# Patient Record
Sex: Male | Born: 1950 | Race: White | Hispanic: No | Marital: Married
Health system: Southern US, Community
[De-identification: ages and names within clinical notes are randomized; demographics above are authoritative.]

## PROBLEM LIST (undated history)

## (undated) DIAGNOSIS — T7840XA Allergy, unspecified, initial encounter: Secondary | ICD-10-CM

## (undated) DIAGNOSIS — C4491 Basal cell carcinoma of skin, unspecified: Secondary | ICD-10-CM

## (undated) DIAGNOSIS — E7800 Pure hypercholesterolemia, unspecified: Secondary | ICD-10-CM

## (undated) HISTORY — DX: Pure hypercholesterolemia, unspecified: E78.00

## (undated) HISTORY — PX: EYE SURGERY: SHX253

## (undated) HISTORY — DX: Basal cell carcinoma of skin, unspecified: C44.91

## (undated) HISTORY — PX: HERNIA REPAIR: SHX51

## (undated) HISTORY — DX: Allergy, unspecified, initial encounter: T78.40XA

---

## 2019-07-25 ENCOUNTER — Other Ambulatory Visit: Payer: Self-pay | Admitting: Otolaryngology

## 2019-07-25 ENCOUNTER — Other Ambulatory Visit (HOSPITAL_BASED_OUTPATIENT_CLINIC_OR_DEPARTMENT_OTHER): Payer: Self-pay | Admitting: Otolaryngology

## 2019-07-25 DIAGNOSIS — R29810 Facial weakness: Secondary | ICD-10-CM

## 2019-07-25 DIAGNOSIS — M5481 Occipital neuralgia: Secondary | ICD-10-CM

## 2019-07-25 DIAGNOSIS — M26621 Arthralgia of right temporomandibular joint: Secondary | ICD-10-CM

## 2019-07-25 DIAGNOSIS — R200 Anesthesia of skin: Secondary | ICD-10-CM

## 2019-07-25 DIAGNOSIS — R6884 Jaw pain: Secondary | ICD-10-CM

## 2019-07-25 DIAGNOSIS — M26601 Right temporomandibular joint disorder, unspecified: Secondary | ICD-10-CM

## 2019-07-25 DIAGNOSIS — R42 Dizziness and giddiness: Secondary | ICD-10-CM

## 2019-07-25 DIAGNOSIS — G501 Atypical facial pain: Secondary | ICD-10-CM

## 2019-07-27 ENCOUNTER — Ambulatory Visit (HOSPITAL_COMMUNITY): Payer: Medicare Other

## 2019-07-28 ENCOUNTER — Other Ambulatory Visit: Payer: Self-pay | Admitting: Otolaryngology

## 2019-07-28 ENCOUNTER — Other Ambulatory Visit (HOSPITAL_BASED_OUTPATIENT_CLINIC_OR_DEPARTMENT_OTHER): Payer: Self-pay | Admitting: Otolaryngology

## 2019-07-28 DIAGNOSIS — R519 Headache, unspecified: Secondary | ICD-10-CM

## 2019-07-28 DIAGNOSIS — R200 Anesthesia of skin: Secondary | ICD-10-CM

## 2019-07-28 DIAGNOSIS — M542 Cervicalgia: Secondary | ICD-10-CM

## 2019-07-28 DIAGNOSIS — R42 Dizziness and giddiness: Secondary | ICD-10-CM

## 2019-07-29 ENCOUNTER — Ambulatory Visit (HOSPITAL_BASED_OUTPATIENT_CLINIC_OR_DEPARTMENT_OTHER)
Admission: RE | Admit: 2019-07-29 | Discharge: 2019-07-29 | Disposition: A | Discharge status: Home / Self Care | Payer: Medicare Other | Source: Ambulatory Visit | Attending: Otolaryngology | Admitting: Otolaryngology

## 2019-07-29 ENCOUNTER — Encounter (HOSPITAL_BASED_OUTPATIENT_CLINIC_OR_DEPARTMENT_OTHER): Payer: Self-pay

## 2019-07-29 ENCOUNTER — Other Ambulatory Visit: Payer: Self-pay

## 2019-07-29 DIAGNOSIS — R519 Headache, unspecified: Secondary | ICD-10-CM | POA: Insufficient documentation

## 2019-07-29 DIAGNOSIS — R200 Anesthesia of skin: Secondary | ICD-10-CM | POA: Insufficient documentation

## 2019-07-29 DIAGNOSIS — M542 Cervicalgia: Secondary | ICD-10-CM | POA: Insufficient documentation

## 2019-07-29 MED ORDER — GADOBUTROL 1 MMOL/ML IV SOLN
7.0000 mL | Freq: Once | INTRAVENOUS | Status: AC | PRN
  Administered 2019-07-29: 7 mL via INTRAVENOUS

## 2020-01-19 ENCOUNTER — Ambulatory Visit: Payer: Medicare Other

## 2021-09-30 IMAGING — MR MR CERVICAL SPINE W/O CM
4 of 6 series · 29 of 48 positions shown · non-contrast
Comparison: None available.

CLINICAL DATA: Initial evaluation for neck pain, headaches.

EXAM:
MRI CERVICAL SPINE WITHOUT CONTRAST
TECHNIQUE: Multiplanar, multisequence MR imaging of the cervical spine was
performed. No intravenous contrast was administered.

[Series 3: (id) tse sag · sagittal · 3.0mm · 0.41mm/px · 6 of 13 slices shown]
[im 1/13]
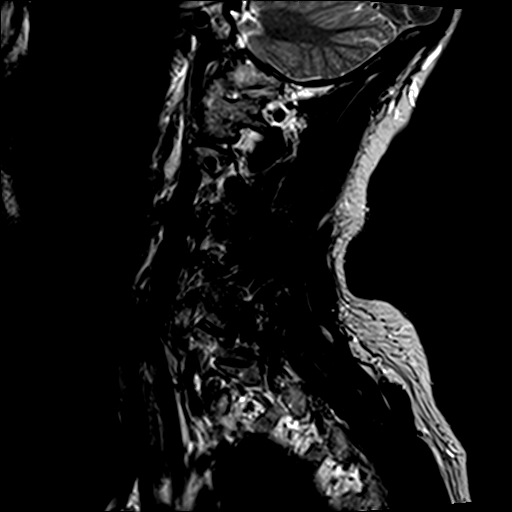
[im 3/13]
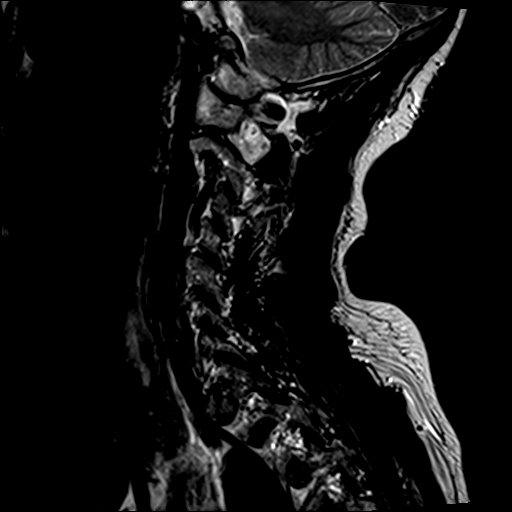
[im 5/13]
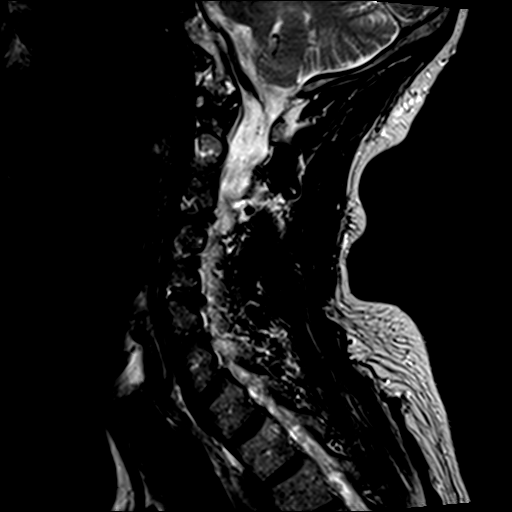
[im 8/13]
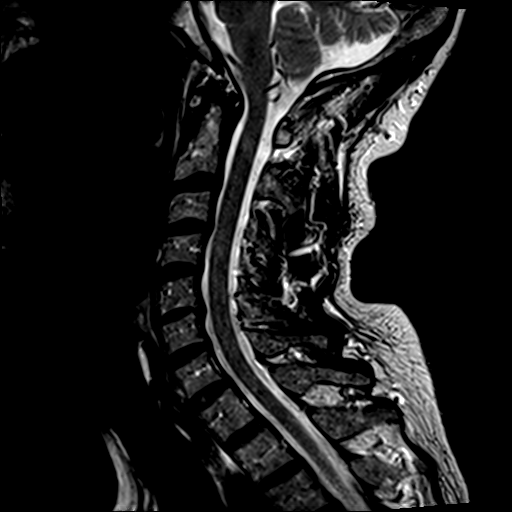
[im 10/13]
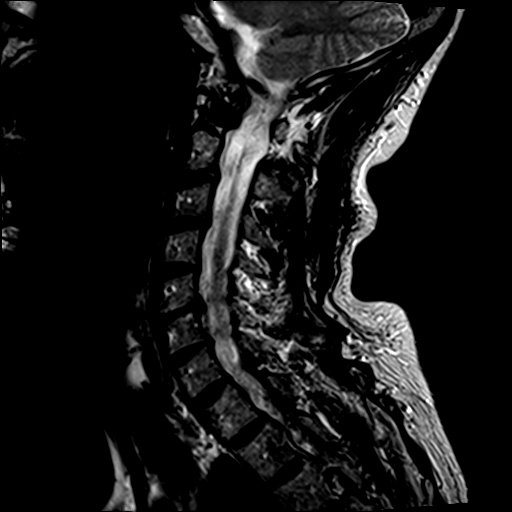
[im 13/13]
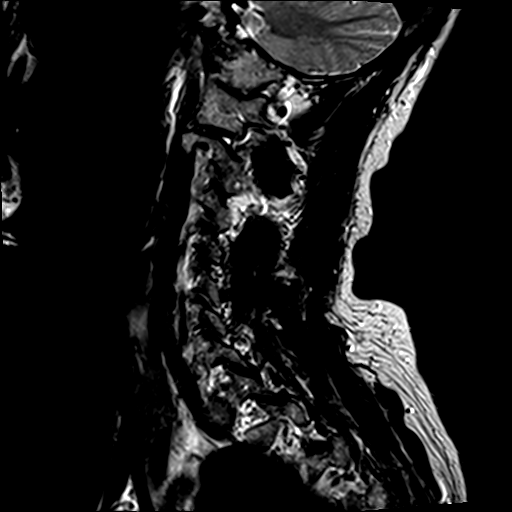

[Series 5: STIR · sagittal · 3.0mm · 0.82mm/px · 5 of 13 slices shown]
[im 1/13]
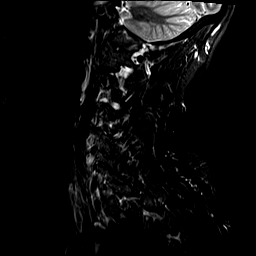
[im 4/13]
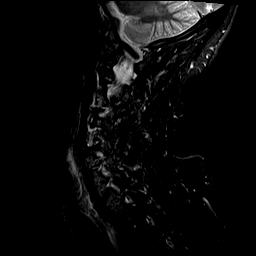
[im 7/13]
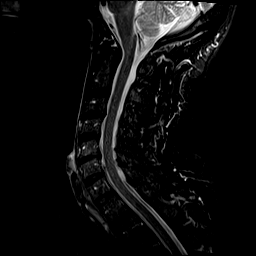
[im 10/13]
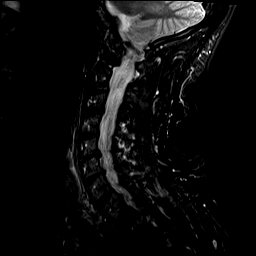
[im 13/13]
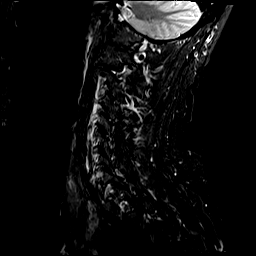

[Series 6: T2 · axial · 3.0mm · 0.39mm/px · z∈[-235,-128]mm · 9 of 32 slices shown (1 of 2)]
[im 1/32]
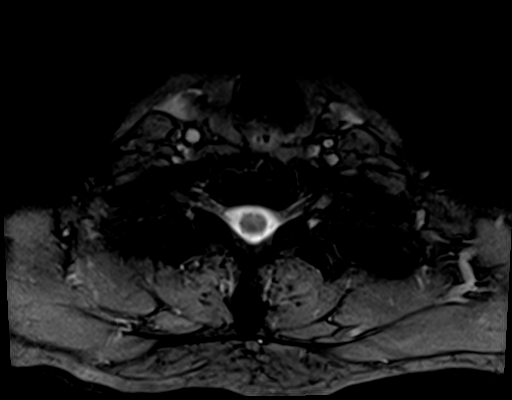
[im 6/32]
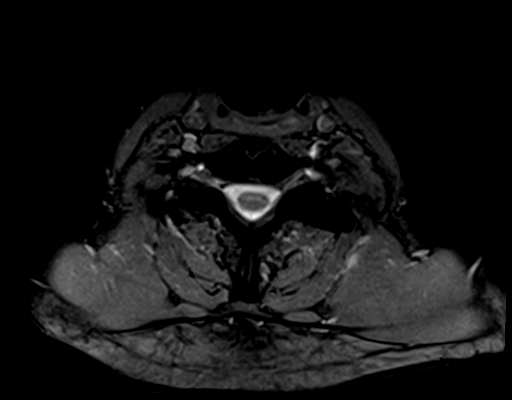
[im 11/32]
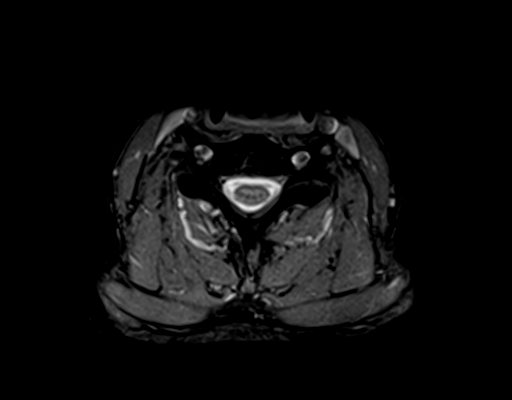
[im 13/32]
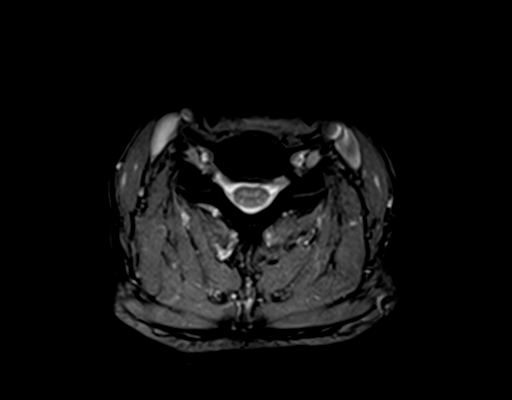
[im 16/32]
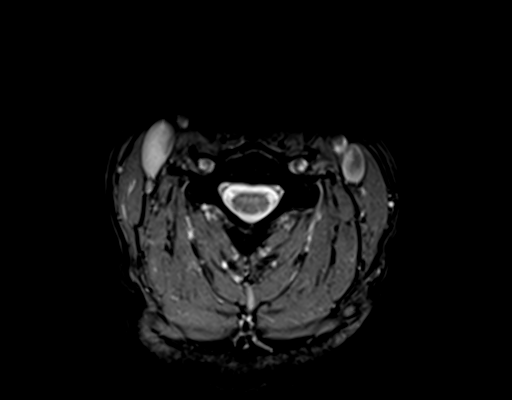
[im 19/32]
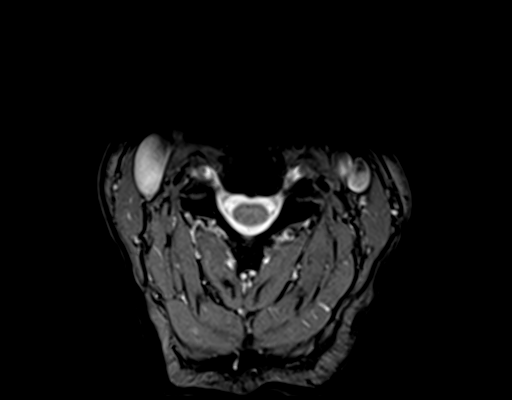
[im 21/32]
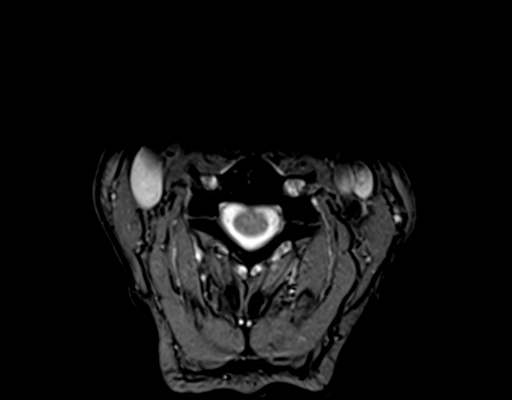
[im 26/32]
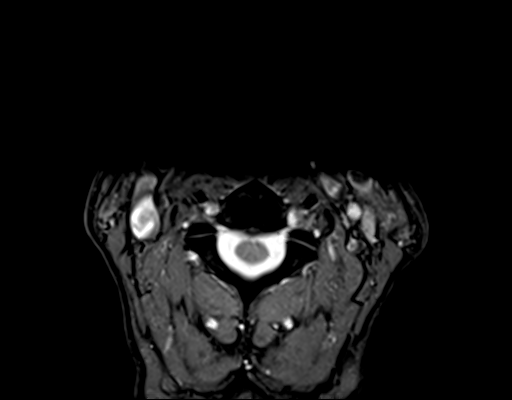
[im 32/32]
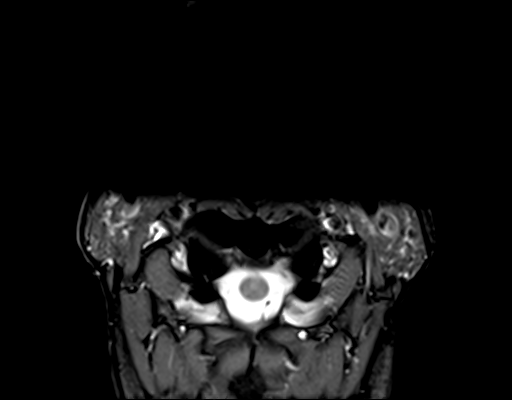

[Series 7: T2 · axial · 3.0mm · 0.78mm/px · z∈[-241,-134]mm · 9 of 32 slices shown (2 of 2)]
[im 1/32]
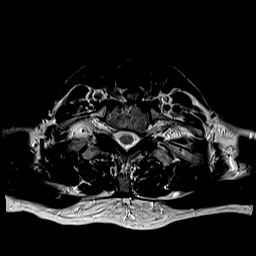
[im 6/32]
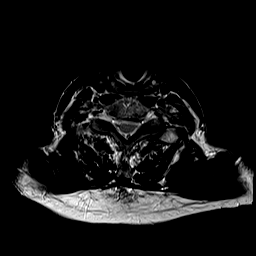
[im 11/32]
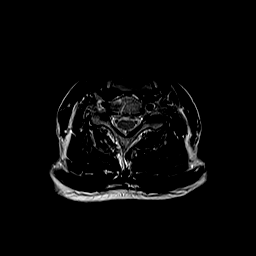
[im 13/32]
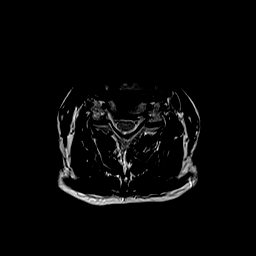
[im 16/32]
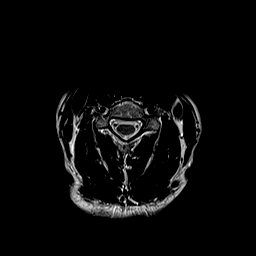
[im 19/32]
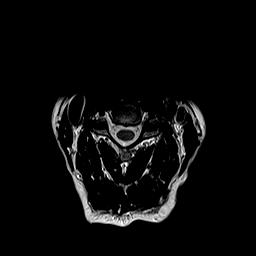
[im 21/32]
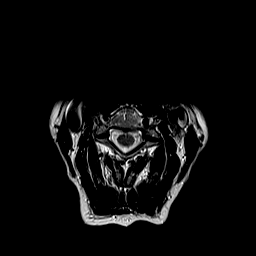
[im 26/32]
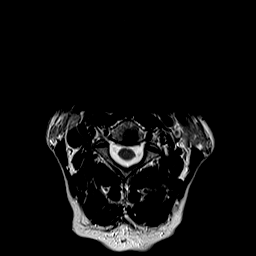
[im 32/32]
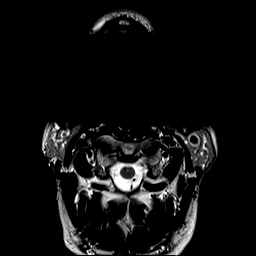

[29 of 48 positions shown; findings below may reference images not displayed]

FINDINGS: Alignment: Vertebral bodies normally aligned with preservation of
the normal cervical lordosis. No listhesis or subluxation.

Vertebrae: Vertebral body height maintained without evidence for
acute or chronic fracture. Bone marrow signal intensity within
normal limits. No worrisome osseous lesions. No abnormal marrow
edema.

Cord: Signal intensity within the cervical spinal cord is normal.
Normal cord caliber and morphology.

Posterior Fossa, vertebral arteries, paraspinal tissues: Visualized
brain and posterior fossa within normal limits. Craniocervical
junction normal. Paraspinous and prevertebral soft tissues within
normal limits. Normal intravascular flow voids seen within the
vertebral arteries bilaterally.

Disc levels:

C2-C3: Disc desiccation without significant disc bulge. No canal or
foraminal stenosis.

C3-C4: Chronic intervertebral disc space narrowing with diffuse disc
bulge. Superimposed shallow central to left paracentral disc
protrusion flattens and partially faces the ventral thecal sac
(series 7, image 11). No significant spinal stenosis or cord
deformity. Superimposed left greater than right uncovertebral
hypertrophy with resultant mild to moderate left C4 foraminal
stenosis. No significant right foraminal narrowing.

C4-C5: Minimal disc bulge with uncovertebral hypertrophy.
Superimposed mild facet degeneration. No significant spinal
stenosis. Mild left worse than right C5 foraminal stenosis.

C5-C6: Chronic intervertebral disc space narrowing with diffuse disc
bulge and bilateral uncovertebral hypertrophy. Flattening and
partial effacement of the ventral thecal sac without significant
spinal stenosis or cord deformity. Moderate bilateral C6 foraminal
stenosis.

C6-C7: Mild disc bulge with uncovertebral and facet hypertrophy. No
significant spinal stenosis. Moderate right worse than left C7
foraminal stenosis.

C7-T1: Minimal disc bulge. Left greater than right facet
hypertrophy. No spinal stenosis. Mild left C8 foraminal stenosis. No
significant right foraminal narrowing.

Visualized upper thoracic spine demonstrates no significant finding.
IMPRESSION: 1. Mild multilevel cervical spondylosis without significant spinal
stenosis or cord deformity.
2. Multifactorial degenerative changes with resultant multilevel
foraminal narrowing as above. Notable findings include mild to
moderate left C4 foraminal stenosis, moderate bilateral C6 and C7
foraminal narrowing, with mild left C8 foraminal stenosis.

## 2022-04-15 ENCOUNTER — Encounter: Payer: Self-pay | Admitting: Pulmonary Disease

## 2022-04-15 ENCOUNTER — Other Ambulatory Visit: Payer: Self-pay | Admitting: Internal Medicine

## 2022-04-15 DIAGNOSIS — R911 Solitary pulmonary nodule: Secondary | ICD-10-CM

## 2022-04-21 ENCOUNTER — Ambulatory Visit
Admission: RE | Admit: 2022-04-21 | Discharge: 2022-04-21 | Disposition: A | Discharge status: Home / Self Care | Payer: Medicare Other | Source: Ambulatory Visit | Attending: Internal Medicine | Admitting: Internal Medicine

## 2022-04-21 DIAGNOSIS — R911 Solitary pulmonary nodule: Secondary | ICD-10-CM

## 2022-04-21 MED ORDER — IOHEXOL 300 MG/ML  SOLN
75.0000 mL | Freq: Once | INTRAMUSCULAR | Status: AC | PRN
  Administered 2022-04-21: 75 mL via INTRAVENOUS

## 2022-07-22 ENCOUNTER — Encounter: Payer: Self-pay | Admitting: Urology

## 2022-07-22 ENCOUNTER — Ambulatory Visit (INDEPENDENT_AMBULATORY_CARE_PROVIDER_SITE_OTHER): Payer: Medicare Other | Admitting: Urology

## 2022-07-22 VITALS — BP 95/62 | HR 68 | Ht 72.0 in | Wt 149.0 lb

## 2022-07-22 DIAGNOSIS — N401 Enlarged prostate with lower urinary tract symptoms: Secondary | ICD-10-CM

## 2022-07-22 DIAGNOSIS — R350 Frequency of micturition: Secondary | ICD-10-CM | POA: Diagnosis not present

## 2022-07-22 LAB — MICROSCOPIC EXAMINATION

## 2022-07-22 LAB — URINALYSIS, COMPLETE
Bilirubin, UA: NEGATIVE
Glucose, UA: NEGATIVE
Ketones, UA: NEGATIVE
Leukocytes,UA: NEGATIVE
Nitrite, UA: NEGATIVE
Protein,UA: NEGATIVE
RBC, UA: NEGATIVE
Specific Gravity, UA: >1.03 — ABNORMAL HIGH (ref 1.005–1.030)
Urobilinogen, Ur: 0.2 mg/dL (ref 0.2–1.0)
pH, UA: 5.5 (ref 5.0–7.5)

## 2022-07-22 LAB — BLADDER SCAN AMB NON-IMAGING: Scan Result: 22

## 2022-07-22 NOTE — Progress Notes (Signed)
   I, Randall Montgomery, acting as a Neurosurgeon for Randall Altes, MD., have documented all relevant documentation on the behalf of Randall Altes, MD, as directed by  Randall Altes, MD while in the presence of Randall Altes, MD.   07/22/2022 12:50 PM   Randall Montgomery 1950/07/11 [id number]  Referring provider: Vida Rigger, MD [address] [place],  [place] [address]  Chief Complaint  Patient presents with   Urinary Frequency    HPI: Randall Montgomery is a 72 y.o. male referred for evaluation of urinary frequency.  His most bothersome LUTS are sensation of incomplete emptying in the morning, frequency, and nocturia x3. He also noted urgency, which is exacerbated with artificial sweeteners. IPSS today 17/35. No dysuria or gross hematuria. No flank, abdominal, or pelvic pain. PSA 01/2022 was 2.48. His wife states he does snore and she occasionally sleeps in a different room, though he had a home sleep study 1.5 years ago, which did not show sleep apnea.   Home Medications:  Allergies as of 07/22/2022       Reactions   Penicillins Rash        Medication List        Accurate as of July 22, 2022 12:50 PM. If you have any questions, ask your nurse or doctor.          simvastatin 40 MG tablet Commonly known as: ZOCOR Take 40 mg by mouth at bedtime.   traZODone 50 MG tablet Commonly known as: DESYREL Take by mouth.        Allergies:  Allergies  Allergen Reactions   Penicillins Rash    Social History:  has no history on file for tobacco use, alcohol use, and drug use.   Physical Exam: BP 95/62   Pulse 68   Ht 6' (1.829 m)   Wt 149 lb (67.6 kg)   BMI 20.21 kg/m   Constitutional:  Alert and oriented, No acute distress. HEENT: [place] AT Respiratory: Normal respiratory effort, no increased work of breathing. GU: 50 g smooth without nodules.Marland Kitchen Psychiatric: Normal mood and affect.  Laboratory: Urinalysis with negative dipstick/microscopy  Assessment &  Plan:    BPH with LUTS Primarily storage related voiding symptoms and nocturia; which are most commonly secondary to BPH. PVR 22 mL. Discussed medical management though he was informed no abnormalities are identified that I would recommend he needed to be treated. We discussed Tamsulsoin as an option and potential side effects were discussed, including orthostatic changes and ejaculatory dysfunction. At this point, he does not desire treatment of his LUTS. If he decideds he wants to try medication within the next year, he can call back for an RX. Offered him an annual follow up vs PRN, and he elected the latter.  I have reviewed the above documentation for accuracy and completeness, and I agree with the above.   Randall Altes, MD  [facility] [address] [place], [place] [address] [phone]

## 2022-10-08 ENCOUNTER — Other Ambulatory Visit: Payer: Self-pay | Admitting: Infectious Diseases

## 2022-10-08 DIAGNOSIS — R911 Solitary pulmonary nodule: Secondary | ICD-10-CM

## 2022-10-08 DIAGNOSIS — A318 Other mycobacterial infections: Secondary | ICD-10-CM

## 2022-10-08 DIAGNOSIS — R053 Chronic cough: Secondary | ICD-10-CM

## 2022-10-20 ENCOUNTER — Ambulatory Visit
Admission: RE | Admit: 2022-10-20 | Discharge: 2022-10-20 | Disposition: A | Discharge status: Home / Self Care | Payer: Medicare Other | Source: Ambulatory Visit | Attending: Infectious Diseases | Admitting: Infectious Diseases

## 2022-10-20 DIAGNOSIS — R053 Chronic cough: Secondary | ICD-10-CM

## 2022-10-20 DIAGNOSIS — R911 Solitary pulmonary nodule: Secondary | ICD-10-CM

## 2022-10-20 DIAGNOSIS — A318 Other mycobacterial infections: Secondary | ICD-10-CM | POA: Diagnosis present

## 2023-03-27 ENCOUNTER — Emergency Department: Payer: Medicare Other

## 2023-03-27 ENCOUNTER — Emergency Department
Admission: EM | Admit: 2023-03-27 | Discharge: 2023-03-27 | Disposition: A | Discharge status: Home / Self Care | Payer: Medicare Other | Attending: Emergency Medicine | Admitting: Emergency Medicine

## 2023-03-27 ENCOUNTER — Other Ambulatory Visit: Payer: Self-pay

## 2023-03-27 DIAGNOSIS — J3489 Other specified disorders of nose and nasal sinuses: Secondary | ICD-10-CM | POA: Diagnosis not present

## 2023-03-27 DIAGNOSIS — I672 Cerebral atherosclerosis: Secondary | ICD-10-CM | POA: Insufficient documentation

## 2023-03-27 DIAGNOSIS — R42 Dizziness and giddiness: Secondary | ICD-10-CM | POA: Insufficient documentation

## 2023-03-27 DIAGNOSIS — R001 Bradycardia, unspecified: Secondary | ICD-10-CM | POA: Insufficient documentation

## 2023-03-27 DIAGNOSIS — M50322 Other cervical disc degeneration at C5-C6 level: Secondary | ICD-10-CM | POA: Diagnosis not present

## 2023-03-27 DIAGNOSIS — E785 Hyperlipidemia, unspecified: Secondary | ICD-10-CM | POA: Insufficient documentation

## 2023-03-27 DIAGNOSIS — R55 Syncope and collapse: Secondary | ICD-10-CM | POA: Insufficient documentation

## 2023-03-27 DIAGNOSIS — D803 Selective deficiency of immunoglobulin G [IgG] subclasses: Secondary | ICD-10-CM | POA: Diagnosis not present

## 2023-03-27 DIAGNOSIS — Z20822 Contact with and (suspected) exposure to covid-19: Secondary | ICD-10-CM | POA: Insufficient documentation

## 2023-03-27 LAB — CBC WITH DIFFERENTIAL/PLATELET
Abs Immature Granulocytes: 0.01 10*3/uL (ref 0.00–0.07)
Basophils Absolute: 0 10*3/uL (ref 0.0–0.1)
Basophils Relative: 0 %
Eosinophils Absolute: 0.1 10*3/uL (ref 0.0–0.5)
Eosinophils Relative: 1 %
HCT: 38.4 % — ABNORMAL LOW (ref 39.0–52.0)
Hemoglobin: 12.7 g/dL — ABNORMAL LOW (ref 13.0–17.0)
Immature Granulocytes: 0 %
Lymphocytes Relative: 34 %
Lymphs Abs: 1.5 10*3/uL (ref 0.7–4.0)
MCH: 32.3 pg (ref 26.0–34.0)
MCHC: 33.1 g/dL (ref 30.0–36.0)
MCV: 97.7 fL (ref 80.0–100.0)
Monocytes Absolute: 0.2 10*3/uL (ref 0.1–1.0)
Monocytes Relative: 6 %
Neutro Abs: 2.6 10*3/uL (ref 1.7–7.7)
Neutrophils Relative %: 59 %
Platelets: 181 10*3/uL (ref 150–400)
RBC: 3.93 MIL/uL — ABNORMAL LOW (ref 4.22–5.81)
RDW: 13.2 % (ref 11.5–15.5)
WBC: 4.4 10*3/uL (ref 4.0–10.5)
nRBC: 0 % (ref 0.0–0.2)

## 2023-03-27 LAB — URINALYSIS, ROUTINE W REFLEX MICROSCOPIC
Bilirubin Urine: NEGATIVE
Glucose, UA: NEGATIVE mg/dL
Hgb urine dipstick: NEGATIVE
Ketones, ur: NEGATIVE mg/dL
Leukocytes,Ua: NEGATIVE
Nitrite: NEGATIVE
Protein, ur: NEGATIVE mg/dL
Specific Gravity, Urine: 1.018 (ref 1.005–1.030)
pH: 6 (ref 5.0–8.0)

## 2023-03-27 LAB — COMPREHENSIVE METABOLIC PANEL
ALT: 18 U/L (ref 0–44)
AST: 23 U/L (ref 15–41)
Albumin: 3.6 g/dL (ref 3.5–5.0)
Alkaline Phosphatase: 54 U/L (ref 38–126)
Anion gap: 10 (ref 5–15)
BUN: 27 mg/dL — ABNORMAL HIGH (ref 8–23)
CO2: 26 mmol/L (ref 22–32)
Calcium: 8.6 mg/dL — ABNORMAL LOW (ref 8.9–10.3)
Chloride: 105 mmol/L (ref 98–111)
Creatinine, Ser: 0.84 mg/dL (ref 0.61–1.24)
GFR, Estimated: >60 mL/min (ref >60)
Glucose, Bld: 91 mg/dL (ref 70–99)
Potassium: 4.1 mmol/L (ref 3.5–5.1)
Sodium: 141 mmol/L (ref 135–145)
Total Bilirubin: 0.6 mg/dL (ref 0.0–1.2)
Total Protein: 6.3 g/dL — ABNORMAL LOW (ref 6.5–8.1)

## 2023-03-27 LAB — TSH: TSH: 2.448 u[IU]/mL (ref 0.350–4.500)

## 2023-03-27 LAB — LACTIC ACID, PLASMA
Lactic Acid, Venous: 2 mmol/L (ref 0.5–1.9)
Lactic Acid, Venous: 1.7 mmol/L (ref 0.5–1.9)

## 2023-03-27 LAB — RESP PANEL BY RT-PCR (RSV, FLU A&B, COVID)  RVPGX2
Influenza A by PCR: NEGATIVE
Influenza B by PCR: NEGATIVE
Resp Syncytial Virus by PCR: NEGATIVE
SARS Coronavirus 2 by RT PCR: NEGATIVE

## 2023-03-27 LAB — MAGNESIUM: Magnesium: 2.5 mg/dL — ABNORMAL HIGH (ref 1.7–2.4)

## 2023-03-27 LAB — TROPONIN I (HIGH SENSITIVITY)
Troponin I (High Sensitivity): 4 ng/L (ref <18)
Troponin I (High Sensitivity): 4 ng/L (ref <18)

## 2023-03-27 LAB — ETHANOL: Alcohol, Ethyl (B): <10 mg/dL (ref <10)

## 2023-03-27 MED ORDER — SODIUM CHLORIDE 0.9 % IV BOLUS (SEPSIS)
1000.0000 mL | Freq: Once | INTRAVENOUS | Status: AC
  Administered 2023-03-27: 1000 mL via INTRAVENOUS

## 2023-03-27 NOTE — ED Notes (Signed)
CCMD called and pt put on cardiac monitoring at this time.

## 2023-03-27 NOTE — ED Notes (Signed)
Lab called and states lactic is 2. MD Ward notified.

## 2023-03-27 NOTE — ED Provider Notes (Signed)
[facility] Provider Note    Event Date/Time   First MD Initiated Contact with Patient 03/27/23 0210     (approximate)   History   Loss of Consciousness (Pt coming from home d/t near syncopal event while using the bathroom this evening. Pt states he felt "fuzzy" and "dizzy" and had "visual disturbances". Pt states he then had another episode a few minutes later where he did pass out for approx. 30 seconds, and wife lowered him to floor. Pt states he had approx. 2 drinks this evening. )   HPI  Randall Montgomery is a 73 y.o. 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  Patient states he was in his normal state of health when he woke up in the middle of the night to go use the bathroom.  States that he sat down on the toilet to use the bathroom and when he stood up he felt lightheaded and had a near syncopal event.  Wife reports patient stood up again and then had a syncopal event with brief myoclonic jerks but no seizure activity or postictal state.  No tongue biting or incontinence.  He did fall to the ground and hit his head.  He is not on blood thinners.  Patient is bradycardic here which she states is his baseline.  He denies any preceding chest pain, shortness of breath.  No recent vomiting or diarrhea.  Denies any numbness, tingling or weakness.  Denies any pain currently.  No headache, neck or back pain.  He denies any recent infectious symptoms including fevers, cough, congestion, urinary symptoms.  Wife reports when EMS arrived patient's blood pressure was low.  No history of PE, DVT, exogenous estrogen use, recent fractures, surgery, trauma, hospitalization, prolonged travel or other immobilization. No lower extremity swelling or pain. No calf tenderness.    History provided by patient, wife.    No past medical history on file.  No past surgical history on  file.  MEDICATIONS:  Prior to Admission medications   Medication Sig Start Date End Date Taking? Authorizing Provider  simvastatin (ZOCOR) 40 MG tablet Take 40 mg by mouth at bedtime.    [provider]  traZODone (DESYREL) 50 MG tablet Take by mouth. 07/14/21   [provider]    Physical Exam   Triage Vital Signs: ED Triage Vitals  Encounter Vitals Group     BP 03/27/23 0210 127/69     Systolic BP Percentile --      Diastolic BP Percentile --      Pulse Rate 03/27/23 0210 (!) 49     Resp 03/27/23 0210 16     Temp 03/27/23 0210 (!) 97.5 F (36.4 C)     Temp Source 03/27/23 0210 Oral     SpO2 03/27/23 0209 96 %     Weight --      Height --      Head Circumference --      Peak Flow --      Pain Score 03/27/23 0211 0     Pain Loc --      Pain Education --      Exclude from Growth Chart --     Most recent vital signs: Vitals:   03/27/23 0530 03/27/23 0630  BP: 116/66 111/69  Pulse: 65 (!) 53  Resp: 20 18  Temp:    SpO2: 98% 96%    CONSTITUTIONAL: Alert, responds appropriately  to questions. Well-appearing; well-nourished HEAD: Normocephalic, atraumatic EYES: Conjunctivae clear, pupils appear equal, sclera nonicteric ENT: normal nose; moist mucous membranes NECK: Supple, normal ROM, no midline spinal tenderness or step-off or deformity CARD: Regular and bradycardic; S1 and S2 appreciated RESP: Normal chest excursion without splinting or tachypnea; breath sounds clear and equal bilaterally; no wheezes, no rhonchi, no rales, no hypoxia or respiratory distress, speaking full sentences ABD/GI: Non-distended; soft, non-tender, no rebound, no guarding, no peritoneal signs BACK: The back appears normal EXT: Normal ROM in all joints; no deformity noted, no, normal sensation, no facial asymmetry edema, no calf tenderness or calf swelling SKIN: Normal color for age and race; warm; no rash on exposed skin NEURO: Moves all extremities equally, normal  speech PSYCH: The patient's mood and manner are appropriate.   ED Results / Procedures / Treatments   LABS: (all labs ordered are listed, but only abnormal results are displayed) Labs Reviewed  CBC WITH DIFFERENTIAL/PLATELET - Abnormal; Notable for the following components:      Result Value   RBC 3.93 (*)    Hemoglobin 12.7 (*)    HCT 38.4 (*)    All other components within normal limits  COMPREHENSIVE METABOLIC PANEL - Abnormal; Notable for the following components:   BUN 27 (*)    Calcium 8.6 (*)    Total Protein 6.3 (*)    All other components within normal limits  MAGNESIUM - Abnormal; Notable for the following components:   Magnesium 2.5 (*)    All other components within normal limits  LACTIC ACID, PLASMA - Abnormal; Notable for the following components:   Lactic Acid, Venous 2.0 (*)    All other components within normal limits  URINALYSIS, ROUTINE W REFLEX MICROSCOPIC - Abnormal; Notable for the following components:   Color, Urine YELLOW (*)    APPearance CLEAR (*)    All other components within normal limits  RESP PANEL BY RT-PCR (RSV, FLU A&B, COVID)  RVPGX2  CULTURE, BLOOD (SINGLE)  TSH  ETHANOL  LACTIC ACID, PLASMA  TROPONIN I (HIGH SENSITIVITY)  TROPONIN I (HIGH SENSITIVITY)     EKG:  EKG Interpretation Date/Time:  Saturday March 27 2023 02:54:58 EST Ventricular Rate:  53 PR Interval:  198 QRS Duration:  88 QT Interval:  436 QTC Calculation: 409 R Axis:   70  Text Interpretation: Sinus bradycardia Otherwise normal ECG No previous ECGs available Confirmed by Rochele Raring [phone]) on 03/27/2023 4:26:13 AM         RADIOLOGY: My personal review and interpretation of imaging: CT head and cervical spine showed no acute traumatic injury.  Chest x-ray shows no widened mediastinum, cardiomegaly or edema.  I have personally reviewed all radiology reports.   DG Chest Portable 1 View Result Date: 03/27/2023 CLINICAL DATA:  Cough EXAM: PORTABLE CHEST 1  VIEW COMPARISON:  10/20/2022 CT FINDINGS: Cardiac shadow is within normal limits. Aortic calcifications are noted. Lungs are well aerated bilaterally. No focal infiltrate or effusion is seen. IMPRESSION: No acute abnormality noted. Electronically Signed   By: Alcide Clever M.D.   On: 03/27/2023 03:41   CT HEAD WO CONTRAST ( ) Result Date: 03/27/2023 CLINICAL DATA:  Head trauma, minor (Age >= 65y); Neck trauma (Age >= 65y) EXAM: CT HEAD WITHOUT CONTRAST CT CERVICAL SPINE WITHOUT CONTRAST TECHNIQUE: Multidetector CT imaging of the head and cervical spine was performed following the standard protocol without intravenous contrast. Multiplanar CT image reconstructions of the cervical spine were also generated. RADIATION DOSE REDUCTION: This exam was  performed according to the departmental dose-optimization program which includes automated exposure control, adjustment of the mA and/or kV according to patient size and/or use of iterative reconstruction technique. COMPARISON:  None Available. FINDINGS: CT HEAD FINDINGS Brain: No evidence of acute infarction, hemorrhage, hydrocephalus, extra-axial collection or mass lesion/mass effect. Vascular: No hyperdense vessel.  Calcific atherosclerosis. Skull: No acute fracture. Sinuses/Orbits: Paranasal sinus mucosal thickening with right maxillary sinus air-fluid level. No acute orbital findings. Other: No mastoid effusions. CT CERVICAL SPINE FINDINGS Alignment: No substantial sagittal subluxation. Skull base and vertebrae: No acute fracture. Soft tissues and spinal canal: No prevertebral fluid or swelling. No visible canal hematoma. Disc levels:  Moderate degenerative disc disease at C5-C6. Upper chest: Visualized lung apices are clear. IMPRESSION: 1. No evidence of acute abnormality intracranially or in the cervical spine. 2. Paranasal sinus mucosal thickening with right maxillary sinus air-fluid level. Electronically Signed   By: Feliberto Harts M.D.   On: 03/27/2023 03:33    CT Cervical Spine Wo Contrast Result Date: 03/27/2023 CLINICAL DATA:  Head trauma, minor (Age >= 65y); Neck trauma (Age >= 65y) EXAM: CT HEAD WITHOUT CONTRAST CT CERVICAL SPINE WITHOUT CONTRAST TECHNIQUE: Multidetector CT imaging of the head and cervical spine was performed following the standard protocol without intravenous contrast. Multiplanar CT image reconstructions of the cervical spine were also generated. RADIATION DOSE REDUCTION: This exam was performed according to the departmental dose-optimization program which includes automated exposure control, adjustment of the mA and/or kV according to patient size and/or use of iterative reconstruction technique. COMPARISON:  None Available. FINDINGS: CT HEAD FINDINGS Brain: No evidence of acute infarction, hemorrhage, hydrocephalus, extra-axial collection or mass lesion/mass effect. Vascular: No hyperdense vessel.  Calcific atherosclerosis. Skull: No acute fracture. Sinuses/Orbits: Paranasal sinus mucosal thickening with right maxillary sinus air-fluid level. No acute orbital findings. Other: No mastoid effusions. CT CERVICAL SPINE FINDINGS Alignment: No substantial sagittal subluxation. Skull base and vertebrae: No acute fracture. Soft tissues and spinal canal: No prevertebral fluid or swelling. No visible canal hematoma. Disc levels:  Moderate degenerative disc disease at C5-C6. Upper chest: Visualized lung apices are clear. IMPRESSION: 1. No evidence of acute abnormality intracranially or in the cervical spine. 2. Paranasal sinus mucosal thickening with right maxillary sinus air-fluid level. Electronically Signed   By: Feliberto Harts M.D.   On: 03/27/2023 03:33     PROCEDURES:  Critical Care performed: No     .1-3 Lead EKG Interpretation  Performed by: Joliene Salvador, Layla Maw, DO Authorized by: Kitai Purdom, Layla Maw, DO     Interpretation: abnormal     ECG rate:  56   ECG rate assessment: bradycardic     Rhythm: sinus bradycardia     Ectopy: none      Conduction: normal       IMPRESSION / MDM / ASSESSMENT AND PLAN / ED COURSE  I reviewed the triage vital signs and the nursing notes.    Patient here after syncopal event with standing.  No acute complaints currently.  The patient is on the cardiac monitor to evaluate for evidence of arrhythmia and/or significant heart rate changes.   DIFFERENTIAL DIAGNOSIS (includes but not limited to):   Orthostasis, dehydration, anemia, electrolyte derangement, sepsis, arrhythmia, ACS, doubt PE or dissection   Patient's presentation is most consistent with acute presentation with potential threat to life or bodily function.   PLAN: Will obtain CT head and cervical spine given patient did fall and hit his head and is over the age of 35.  He is  neurologically intact here without complaints of headache, neck or back pain and no focal neurologic deficits.  Will also check cardiac labs today.  He has no risk factors for PE other than age and is not tachycardic, tachypneic or hypoxic.  He does have a history of IgG deficiency but is not having any infectious symptoms but will check lactic, chest x-ray, urine to ensure no signs of infection today that could have caused him to drop his blood pressure and pass out.  Will give IV fluids.   MEDICATIONS GIVEN IN ED: Medications  sodium chloride 0.9 % bolus 1,000 mL (0 mLs Intravenous Stopped 03/27/23 0412)     ED COURSE: Patient feeling better.  CT head and cervical spine reviewed and interpreted by myself and the radiologist and show no acute traumatic injury.  Orthostats negative.  Troponin x 2 negative.  Normal hemoglobin and electrolytes.  Initial lactic borderline elevated at 2.0.  On recheck, it is normalized.  Chest x-ray reviewed and interpreted by myself and radiologist and shows no sign of infection, widened mediastinum, cardiomegaly.  Urine shows no sign of infection or ketones to suggest dehydration.  Normal hemoglobin.  Patient has been  bradycardic here, lowest heart rate I have seen is 46.  No ischemic changes on EKG, no interval abnormalities, AV blocks.  He states he has a history of bradycardia.  I suspect his episode today was secondary to orthostasis.  I feel he is safe to go home and follow-up closely with his primary care doctor.  Patient is comfortable with this plan and would prefer discharge home.  I also updated his wife by phone who left the bedside to go home for a brief time.  She is also comfortable with this plan.   At this time, I do not feel there is any life-threatening condition present. I reviewed all nursing notes, vitals, pertinent previous records.  All lab and urine results, EKGs, imaging ordered have been independently reviewed and interpreted by myself.  I reviewed all available radiology reports from any imaging ordered this visit.  Based on my assessment, I feel the patient is safe to be discharged home without further emergent workup and can continue workup as an outpatient as needed. Discussed all findings, treatment plan as well as usual and customary return precautions.  They verbalize understanding and are comfortable with this plan.  Outpatient follow-up has been provided as needed.  All questions have been answered.   CONSULTS: Admission considered but workup here is reassuring and symptoms likely secondary to orthostasis.  Patient comfortable plan for close outpatient follow-up.   OUTSIDE RECORDS REVIEWED: Reviewed last PCP note on 02/18/2023.       FINAL CLINICAL IMPRESSION(S) / ED DIAGNOSES   Final diagnoses:  Syncope, unspecified syncope type     Rx / DC Orders   ED Discharge Orders     None        Note:  This document was prepared using Dragon voice recognition software and may include unintentional dictation errors.   Jayelle Page, Layla Maw, DO 03/27/23 1152

## 2023-04-01 LAB — CULTURE, BLOOD (SINGLE): Culture: NO GROWTH

## 2023-06-23 ENCOUNTER — Telehealth: Payer: Self-pay

## 2023-06-23 NOTE — Telephone Encounter (Signed)
 Ok to schedule establish care appt (referred by Dr Jaclynn Mast)

## 2023-06-23 NOTE — Telephone Encounter (Signed)
 Copied from CRM [id number]. Topic: Appointments - Appointment Scheduling >> Jun 23, 2023 11:40 AM Allyne Areola wrote: Patient/patient representative is calling to schedule an appointment. Refer to attachments for appointment information. Patient is being referred by Dr. Jaclynn Mast to establish care with Dr.Scott. I advised that she was not accepting new patient's at the moment; however, he would like to know an exception could be made. His current PCP is not as responsive and helpful.

## 2023-06-24 NOTE — Telephone Encounter (Signed)
Please schedule new pt appt.

## 2023-06-28 ENCOUNTER — Other Ambulatory Visit: Payer: Self-pay | Admitting: Pulmonary Disease

## 2023-06-28 DIAGNOSIS — R911 Solitary pulmonary nodule: Secondary | ICD-10-CM

## 2023-06-28 DIAGNOSIS — J45901 Unspecified asthma with (acute) exacerbation: Secondary | ICD-10-CM

## 2023-06-28 DIAGNOSIS — R058 Other specified cough: Secondary | ICD-10-CM

## 2023-07-12 ENCOUNTER — Ambulatory Visit
Admission: RE | Admit: 2023-07-12 | Discharge: 2023-07-12 | Disposition: A | Discharge status: Home / Self Care | Source: Ambulatory Visit | Attending: Pulmonary Disease | Admitting: Pulmonary Disease

## 2023-07-12 DIAGNOSIS — J45901 Unspecified asthma with (acute) exacerbation: Secondary | ICD-10-CM | POA: Insufficient documentation

## 2023-07-12 DIAGNOSIS — R911 Solitary pulmonary nodule: Secondary | ICD-10-CM | POA: Diagnosis present

## 2023-07-12 DIAGNOSIS — R058 Other specified cough: Secondary | ICD-10-CM | POA: Insufficient documentation

## 2023-07-27 ENCOUNTER — Other Ambulatory Visit: Payer: Self-pay | Admitting: Podiatry

## 2023-07-27 DIAGNOSIS — M722 Plantar fascial fibromatosis: Secondary | ICD-10-CM

## 2023-07-27 DIAGNOSIS — M62461 Contracture of muscle, right lower leg: Secondary | ICD-10-CM

## 2023-07-29 ENCOUNTER — Ambulatory Visit: Admitting: Internal Medicine

## 2023-07-31 ENCOUNTER — Ambulatory Visit
Admission: RE | Admit: 2023-07-31 | Discharge: 2023-07-31 | Disposition: A | Discharge status: Home / Self Care | Source: Ambulatory Visit | Attending: Podiatry | Admitting: Podiatry

## 2023-07-31 DIAGNOSIS — M722 Plantar fascial fibromatosis: Secondary | ICD-10-CM

## 2023-07-31 DIAGNOSIS — M62461 Contracture of muscle, right lower leg: Secondary | ICD-10-CM

## 2023-10-05 ENCOUNTER — Ambulatory Visit (INDEPENDENT_AMBULATORY_CARE_PROVIDER_SITE_OTHER): Admitting: Internal Medicine

## 2023-10-05 ENCOUNTER — Encounter: Payer: Self-pay | Admitting: Internal Medicine

## 2023-10-05 VITALS — BP 100/68 | HR 63 | Resp 16 | Ht 72.0 in | Wt 153.0 lb

## 2023-10-05 DIAGNOSIS — M722 Plantar fascial fibromatosis: Secondary | ICD-10-CM

## 2023-10-05 DIAGNOSIS — R0989 Other specified symptoms and signs involving the circulatory and respiratory systems: Secondary | ICD-10-CM | POA: Diagnosis not present

## 2023-10-05 DIAGNOSIS — E7800 Pure hypercholesterolemia, unspecified: Secondary | ICD-10-CM | POA: Diagnosis not present

## 2023-10-05 DIAGNOSIS — R739 Hyperglycemia, unspecified: Secondary | ICD-10-CM

## 2023-10-05 DIAGNOSIS — Z87898 Personal history of other specified conditions: Secondary | ICD-10-CM

## 2023-10-05 MED ORDER — CLOTRIMAZOLE-BETAMETHASONE 1-0.05 % EX CREA: 1.0000 | TOPICAL_CREAM | Freq: Every day | CUTANEOUS | 0 refills | Status: DC

## 2023-10-05 NOTE — Progress Notes (Signed)
 Subjective:    Patient ID: Randall Montgomery, male    DOB: 12-31-1950, 73 y.o.   MRN: [id number]  Patient here for  Chief Complaint  Patient presents with   New Patient (Initial Visit)    HPI Here to establish care. Seeing Dr Aleskerov for cough and frequent throat clearing. No significant improvement with advair and symbicort. CT - small nodules in the RUL. Some scarring in the lungs. Temporary relief with systemic steroids. Felt scarring may cause architectural distortion and mucus accumulation - leading to cough. Recommended dupixent. In reviewing, it appears this is in the process of getting PA.  Also has a history of hypercholesterolemia. Currently on simvastatin. On questioning further, he does have intermittent issues with clearing his throat. Voice change - intermittent. Occasional white phlegm production. Has a history of ulcer disease. Denies any increased acid reflux. No actual dysphagia. Has undergone allergy testing. Has been treated with various allergy medications. No change in symptoms. No sob. Stays active. Plays pickle ball 3x/week and walks. Has had issues with plantar fasciitis. S/p injection and PT. Continued stretches and supports. Some nocturia - may occur 2-3x/night.    Past Medical History:  Diagnosis Date   Allergy 1960   Penicillin   Basal cell carcinoma    Hypercholesterolemia    Past Surgical History:  Procedure Laterality Date   EYE SURGERY  11/10/2022   Torn retina-fully repaired   HERNIA REPAIR  2000 and 2015?   No problems sionce   Family History  Problem Relation Age of Onset   Cancer Sister    Social History   Socioeconomic History   Marital status: Married    Spouse name: Not on file   Number of children: Not on file   Years of education: Not on file   Highest education level: Master's degree (e.g., MA, MS, MEng, MEd, MSW, MBA)  Occupational History   Not on file  Tobacco Use   Smoking status: Never   Smokeless tobacco: Never  Substance and  Sexual Activity   Alcohol use: Yes    Alcohol/week: 2.0 standard drinks of alcohol    Types: 2 Cans of beer per week   Drug use: Never   Sexual activity: Yes    Birth control/protection: Post-menopausal    Comment: With wife only  Other Topics Concern   Not on file  Social History Narrative   Not on file   Social Drivers of Health   Financial Resource Strain: Low Risk  (10/04/2023)   Overall Financial Resource Strain (CARDIA)    Difficulty of Paying Living Expenses: Not hard at all  Food Insecurity: No Food Insecurity (10/04/2023)   Hunger Vital Sign    Worried About Running Out of Food in the Last Year: Never true    Ran Out of Food in the Last Year: Never true  Transportation Needs: No Transportation Needs (10/04/2023)   PRAPARE - Administrator, Civil Service (Medical): No    Lack of Transportation (Non-Medical): No  Physical Activity: Sufficiently Active (10/04/2023)   Exercise Vital Sign    Days of Exercise per Week: 5 days    Minutes of Exercise per Session: 60 min  Stress: Stress Concern Present (10/04/2023)   [employer] of Occupational Health - Occupational Stress Questionnaire    Feeling of Stress: To some extent  Social Connections: Socially Integrated (10/04/2023)   Social Connection and Isolation Panel    Frequency of Communication with Friends and Family: Three times a week  Frequency of Social Gatherings with Friends and Family: Twice a week    Attends Religious Services: More than 4 times per year    Active Member of [employer] or Organizations: Yes    Attends Engineer, structural: More than 4 times per year    Marital Status: Married     Review of Systems  Constitutional:  Negative for appetite change and unexpected weight change.  HENT:  Negative for sinus pressure.        Clearing of throat. Some phlegm - occasionally productive.   Respiratory:  Negative for chest tightness and shortness of breath.   Cardiovascular:  Negative for  chest pain, palpitations and leg swelling.  Gastrointestinal:  Negative for abdominal pain, diarrhea, nausea and vomiting.  Genitourinary:  Negative for difficulty urinating and dysuria.  Musculoskeletal:  Negative for myalgias.       Issues with plantar fasciitis.   Skin:  Negative for color change and rash.  Neurological:  Negative for dizziness and headaches.  Psychiatric/Behavioral:  Negative for agitation and dysphoric mood.        Objective:     BP 100/68   Pulse 63   Resp 16   Ht 6' (1.829 m)   Wt 153 lb (69.4 kg)   SpO2 98%   BMI 20.75 kg/m  Wt Readings from Last 3 Encounters:  10/05/23 153 lb (69.4 kg)  07/22/22 149 lb (67.6 kg)    Physical Exam Vitals reviewed.  Constitutional:      General: He is not in acute distress.    Appearance: Normal appearance. He is well-developed.  HENT:     Head: Normocephalic and atraumatic.     Right Ear: External ear normal.     Left Ear: External ear normal.     Mouth/Throat:     Pharynx: No oropharyngeal exudate or posterior oropharyngeal erythema.  Eyes:     General: No scleral icterus.       Right eye: No discharge.        Left eye: No discharge.     Conjunctiva/sclera: Conjunctivae normal.  Cardiovascular:     Rate and Rhythm: Normal rate and regular rhythm.  Pulmonary:     Effort: Pulmonary effort is normal. No respiratory distress.     Breath sounds: Normal breath sounds.  Abdominal:     General: Bowel sounds are normal.     Palpations: Abdomen is soft.     Tenderness: There is no abdominal tenderness.  Musculoskeletal:        General: No swelling or tenderness.     Cervical back: Neck supple. No tenderness.  Lymphadenopathy:     Cervical: No cervical adenopathy.  Skin:    Findings: No erythema or rash.  Neurological:     Mental Status: He is alert.  Psychiatric:        Mood and Affect: Mood normal.        Behavior: Behavior normal.         Outpatient Encounter Medications as of 10/05/2023   Medication Sig   clotrimazole -betamethasone  (LOTRISONE ) cream Apply 1 Application topically daily.   ascorbic acid (VITAMIN C) 500 MG tablet Take 500 mg by mouth daily.   Cholecalciferol (D 1000) 25 MCG (1000 UT) capsule Take 1,000 Units by mouth daily.   Potassium Citrate,Elemental K, 99 MG CAPS Take 1 capsule by mouth daily.   simvastatin (ZOCOR) 40 MG tablet Take 40 mg by mouth at bedtime.   traZODone (DESYREL) 50 MG tablet Take by mouth.  No facility-administered encounter medications on file as of 10/05/2023.     Lab Results  Component Value Date   WBC 4.4 03/27/2023   HGB 12.7 (L) 03/27/2023   HCT 38.4 (L) 03/27/2023   PLT 181 03/27/2023   GLUCOSE 91 03/27/2023   ALT 18 03/27/2023   AST 23 03/27/2023   NA 141 03/27/2023   K 4.1 03/27/2023   CL 105 03/27/2023   CREATININE 0.84 03/27/2023   BUN 27 (H) 03/27/2023   CO2 26 03/27/2023   TSH 2.448 03/27/2023    MR ANKLE RIGHT WO CONTRAST Result Date: 07/31/2023 EXAM DESCRIPTION: MR ANKLE RIGHT WO CONTRAST CLINICAL HISTORY: Pain. COMPARISON: None Available. TECHNIQUE: MRI of the ankle is performed according to our usual protocol with multiplanar multi sequence imaging. FINDINGS: No fracture. No erosions. No significant degenerative change. Small spur at the origin of the plantar fascia with mild associated marrow edema. Marrow signal otherwise unremarkable. No joint effusion. Adequate fat in the sinus tarsi. There is mild thickening and intermediate signal to the medial origin of the plantar fascia. Mild edema to the directly surrounding soft tissues. Findings consistent with mild plantar fasciitis. Mild likely reactive marrow edema to the underlying spur. The tendons and musculature are otherwise unremarkable. The ligaments are intact. IMPRESSION: Mild active plantar fasciitis to the medial origin of the plantar fascia. Electronically signed by: Reyes Frees MD 07/31/2023 03:12 PM EDT RP Workstation: [id number]       Assessment  & Plan:  Hypercholesterolemia Assessment & Plan: On simvastatin. Continue diet and exercise. Check lipid panel.    Hyperglycemia Assessment & Plan: Low carb diet and exercise. Follow met b and A1c.    Plantar fasciitis Assessment & Plan: Has seen podiatry. S/p injection and PT. Stretches. Supports. Avoid going barefooted. Follow.    Throat clearing Assessment & Plan: Throat clearing and phlegm in his throat as outlined. Persistent issues. Is seeing pulmonary as outlined. Has been allergy tested. Has tried medications - for allergies, reflux, steroids and inhalers. May get temporary relief with steroids. Has seen ENT previously. Will obtain records. Some intermittent voice change. Discussed f/u with ENT for laryngoscopy.    History of ulcer disease Assessment & Plan: Denies any acid reflux symptoms. Has tried medication for acid reflux with no improvement.    Other orders -     Clotrimazole -Betamethasone ; Apply 1 Application topically daily.  Dispense: 30 g; Refill: 0     Allena Hamilton, MD

## 2023-10-11 ENCOUNTER — Telehealth: Payer: Self-pay | Admitting: Internal Medicine

## 2023-10-11 ENCOUNTER — Encounter: Payer: Self-pay | Admitting: Internal Medicine

## 2023-10-11 DIAGNOSIS — R0989 Other specified symptoms and signs involving the circulatory and respiratory systems: Secondary | ICD-10-CM | POA: Insufficient documentation

## 2023-10-11 DIAGNOSIS — M722 Plantar fascial fibromatosis: Secondary | ICD-10-CM | POA: Insufficient documentation

## 2023-10-11 DIAGNOSIS — Z87898 Personal history of other specified conditions: Secondary | ICD-10-CM | POA: Insufficient documentation

## 2023-10-11 NOTE — Telephone Encounter (Signed)
 This is a new pt to me. I need any records from [facility] ENT. Please hold message until records are received.  Thanks

## 2023-10-11 NOTE — Assessment & Plan Note (Signed)
 Throat clearing and phlegm in his throat as outlined. Persistent issues. Is seeing pulmonary as outlined. Has been allergy tested. Has tried medications - for allergies, reflux, steroids and inhalers. May get temporary relief with steroids. Has seen ENT previously. Will obtain records. Some intermittent voice change. Discussed f/u with ENT for laryngoscopy.

## 2023-10-11 NOTE — Assessment & Plan Note (Signed)
 Denies any acid reflux symptoms. Has tried medication for acid reflux with no improvement.

## 2023-10-11 NOTE — Assessment & Plan Note (Signed)
 On simvastatin. Continue diet and exercise. Check lipid panel.

## 2023-10-11 NOTE — Assessment & Plan Note (Signed)
 Has seen podiatry. S/p injection and PT. Stretches. Supports. Avoid going barefooted. Follow.

## 2023-10-11 NOTE — Assessment & Plan Note (Signed)
 Low-carb diet and exercise.  Follow met b and A1c.

## 2023-10-12 NOTE — Telephone Encounter (Signed)
 I have faxed over a request to ENT to get records.

## 2024-01-03 ENCOUNTER — Encounter: Payer: Self-pay | Admitting: Internal Medicine

## 2024-01-03 ENCOUNTER — Ambulatory Visit (INDEPENDENT_AMBULATORY_CARE_PROVIDER_SITE_OTHER): Admitting: Internal Medicine

## 2024-01-03 VITALS — BP 100/50 | HR 58 | Temp 97.9°F | Ht 72.0 in | Wt 154.5 lb

## 2024-01-03 DIAGNOSIS — R739 Hyperglycemia, unspecified: Secondary | ICD-10-CM

## 2024-01-03 DIAGNOSIS — Z125 Encounter for screening for malignant neoplasm of prostate: Secondary | ICD-10-CM | POA: Diagnosis not present

## 2024-01-03 DIAGNOSIS — E7800 Pure hypercholesterolemia, unspecified: Secondary | ICD-10-CM

## 2024-01-03 DIAGNOSIS — H9190 Unspecified hearing loss, unspecified ear: Secondary | ICD-10-CM

## 2024-01-03 DIAGNOSIS — R0989 Other specified symptoms and signs involving the circulatory and respiratory systems: Secondary | ICD-10-CM | POA: Diagnosis not present

## 2024-01-03 DIAGNOSIS — Z820 Family history of epilepsy and other diseases of the nervous system: Secondary | ICD-10-CM

## 2024-01-03 NOTE — Progress Notes (Signed)
 Subjective:    Patient ID: Randall Montgomery, male    DOB: Nov 16, 1950, 73 y.o.   MRN: [id number]  Patient here for  Chief Complaint  Patient presents with   Medical Management of Chronic Issues    3 mth f/u    HPI Here for a scheduled follow up - follow up regarding persistent issues with throat clearing, hyperglycemia and hypercholesterolemia. Saw GI 11/16/23 - planning colonoscopy - scheduled for 02/2024. Had f/u with pulmonary - 12/06/23 - f/u regarding persistent throat irritation and chronic cough. Also has a history of asthma-like symptoms that per report responds to steroids. His past workup includes a CT scan from June 2025 showing tiny right upper lobe nodules and small areas of scarring. A previous CT scan from September 2024 was also mentioned. Pulmonary function tests in May 2025 showed normal spirometry and lung volumes, with an elevated DLCO at 118%. Allergy testing showed positive skin tests. 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 Also recommended prednisone 5mg  daily and dupixent. Received first dose 12/29/23. Discussed previous w/up. Reviewed previous ENT evaluation. Discussed f/u with  ENT- question of need for laryngoscopy. Tries to stay active. Breathing overall stable. Discussed his family history - mother, sister and father - alzheimers. He is interested in further evaluation. Discussed referral to Dr Maree. Also discussed his hearing. Refer to ENT for hearing evaluation as well.    Past Medical History:  Diagnosis Date   Allergy 1960   Penicillin   Basal cell carcinoma    Hypercholesterolemia    Past Surgical History:  Procedure Laterality Date   EYE SURGERY  11/10/2022   Torn retina-fully repaired   HERNIA REPAIR  2000 and 2015?   No problems sionce   Family History  Problem Relation Age of Onset   Cancer Sister    Social History   Socioeconomic History   Marital status: Married    Spouse name: Not on file   Number of  children: Not on file   Years of education: Not on file   Highest education level: Master's degree (e.g., MA, MS, MEng, MEd, MSW, MBA)  Occupational History   Not on file  Tobacco Use   Smoking status: Never   Smokeless tobacco: Never  Substance and Sexual Activity   Alcohol use: Yes    Alcohol/week: 2.0 standard drinks of alcohol    Types: 2 Cans of beer per week   Drug use: Never   Sexual activity: Yes    Birth control/protection: Post-menopausal    Comment: With wife only  Other Topics Concern   Not on file  Social History Narrative   Not on file   Social Drivers of Health   Financial Resource Strain: Low Risk  (12/31/2023)   Overall Financial Resource Strain (CARDIA)    Difficulty of Paying Living Expenses: Not hard at all  Food Insecurity: No Food Insecurity (12/31/2023)   Hunger Vital Sign    Worried About Running Out of Food in the Last Year: Never true    Ran Out of Food in the Last Year: Never true  Transportation Needs: No Transportation Needs (12/31/2023)   PRAPARE - Administrator, Civil Service (Medical): No    Lack of Transportation (Non-Medical): No  Physical Activity: Sufficiently Active (12/31/2023)   Exercise Vital Sign    Days of Exercise per Week: 5 days    Minutes of Exercise per Session: 60 min  Stress: Stress Concern Present (12/31/2023)  [employer] of Occupational Health - Occupational Stress Questionnaire    Feeling of Stress: To some extent  Social Connections: Socially Integrated (12/31/2023)   Social Connection and Isolation Panel    Frequency of Communication with Friends and Family: Three times a week    Frequency of Social Gatherings with Friends and Family: Twice a week    Attends Religious Services: More than 4 times per year    Active Member of [employer] or Organizations: Yes    Attends Engineer, Structural: More than 4 times per year    Marital Status: Married     Review of Systems  Constitutional:   Negative for appetite change and unexpected weight change.  HENT:  Negative for congestion and sinus pressure.   Respiratory:  Negative for chest tightness.        Breathing stable. Increased - clearing of throat.   Cardiovascular:  Negative for chest pain, palpitations and leg swelling.  Gastrointestinal:  Negative for abdominal pain, diarrhea, nausea and vomiting.  Genitourinary:  Negative for difficulty urinating and dysuria.  Musculoskeletal:  Negative for joint swelling and myalgias.  Skin:  Negative for color change and rash.  Neurological:  Negative for dizziness and headaches.  Psychiatric/Behavioral:  Negative for agitation and dysphoric mood.        Objective:     BP (!) 100/50   Pulse (!) 58   Temp 97.9 F (36.6 C) (Oral)   Ht 6' (1.829 m)   Wt 154 lb 8 oz (70.1 kg)   SpO2 98%   BMI 20.95 kg/m  Wt Readings from Last 3 Encounters:  01/03/24 154 lb 8 oz (70.1 kg)  10/05/23 153 lb (69.4 kg)  07/22/22 149 lb (67.6 kg)    Physical Exam Vitals reviewed.  Constitutional:      General: He is not in acute distress.    Appearance: Normal appearance. He is well-developed.  HENT:     Head: Normocephalic and atraumatic.     Right Ear: External ear normal.     Left Ear: External ear normal.     Mouth/Throat:     Pharynx: No oropharyngeal exudate or posterior oropharyngeal erythema.  Eyes:     General: No scleral icterus.       Right eye: No discharge.        Left eye: No discharge.     Conjunctiva/sclera: Conjunctivae normal.  Cardiovascular:     Rate and Rhythm: Normal rate and regular rhythm.  Pulmonary:     Effort: Pulmonary effort is normal. No respiratory distress.     Breath sounds: Normal breath sounds.  Abdominal:     General: Bowel sounds are normal.     Palpations: Abdomen is soft.     Tenderness: There is no abdominal tenderness.  Musculoskeletal:        General: No swelling or tenderness.     Cervical back: Neck supple. No tenderness.   Lymphadenopathy:     Cervical: No cervical adenopathy.  Skin:    Findings: No erythema or rash.  Neurological:     Mental Status: He is alert.  Psychiatric:        Mood and Affect: Mood normal.        Behavior: Behavior normal.         Outpatient Encounter Medications as of 01/03/2024  Medication Sig   ascorbic acid (VITAMIN C) 500 MG tablet Take 500 mg by mouth daily.   Cholecalciferol (D 1000) 25 MCG (1000 UT) capsule Take 1,000  Units by mouth daily.   Potassium Citrate,Elemental K, 99 MG CAPS Take 1 capsule by mouth daily.   simvastatin (ZOCOR) 40 MG tablet Take 40 mg by mouth at bedtime.   traZODone (DESYREL) 50 MG tablet Take by mouth.   [DISCONTINUED] clotrimazole -betamethasone  (LOTRISONE ) cream Apply 1 Application topically daily.   No facility-administered encounter medications on file as of 01/03/2024.     Lab Results  Component Value Date   WBC 4.4 03/27/2023   HGB 12.7 (L) 03/27/2023   HCT 38.4 (L) 03/27/2023   PLT 181 03/27/2023   GLUCOSE 91 03/27/2023   ALT 18 03/27/2023   AST 23 03/27/2023   NA 141 03/27/2023   K 4.1 03/27/2023   CL 105 03/27/2023   CREATININE 0.84 03/27/2023   BUN 27 (H) 03/27/2023   CO2 26 03/27/2023   TSH 2.448 03/27/2023    MR ANKLE RIGHT WO CONTRAST Result Date: 07/31/2023 EXAM DESCRIPTION: MR ANKLE RIGHT WO CONTRAST CLINICAL HISTORY: Pain. COMPARISON: None Available. TECHNIQUE: MRI of the ankle is performed according to our usual protocol with multiplanar multi sequence imaging. FINDINGS: No fracture. No erosions. No significant degenerative change. Small spur at the origin of the plantar fascia with mild associated marrow edema. Marrow signal otherwise unremarkable. No joint effusion. Adequate fat in the sinus tarsi. There is mild thickening and intermediate signal to the medial origin of the plantar fascia. Mild edema to the directly surrounding soft tissues. Findings consistent with mild plantar fasciitis. Mild likely reactive  marrow edema to the underlying spur. The tendons and musculature are otherwise unremarkable. The ligaments are intact. IMPRESSION: Mild active plantar fasciitis to the medial origin of the plantar fascia. Electronically signed by: Reyes Frees MD 07/31/2023 03:12 PM EDT RP Workstation: [id number]       Assessment & Plan:  Hypercholesterolemia Assessment & Plan: On simvastatin. Continue diet and exercise. Follow lipid panel.   Orders: -     CBC with Differential/Platelet; Future -     Lipid panel; Future -     TSH; Future -     Hepatic Function Panel; Future -     Basic metabolic panel with GFR; Future  Hyperglycemia Assessment & Plan: Low carb diet and exercise. Follow met b and A1c.   Orders: -     Hemoglobin A1c; Future  Prostate cancer screening -     PSA, Medicare; Future  Throat clearing Assessment & Plan: Had f/u with pulmonary - 12/06/23 - f/u regarding persistent throat irritation and chronic cough. Also has a history of asthma-like symptoms that per report responds to steroids. His past workup includes a CT scan from June 2025 showing tiny right upper lobe nodules and small areas of scarring. A previous CT scan from September 2024 was also mentioned. Pulmonary function tests in May 2025 showed normal spirometry and lung volumes, with an elevated DLCO at 118%. Allergy testing showed positive skin tests. 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 Also recommended prednisone 5mg  daily and dupixent. Received first dose 12/29/23. Discussed previous w/up. Reviewed previous ENT evaluation. Discussed f/u with  ENT- question of need for laryngoscopy. Also, contacted GI. He is scheduled for colonoscopy 02/2024 and was referred for question of EGD. GI to review to see if needed, will do both procedures at the same time.   Orders: -     Ambulatory referral to ENT  Family history of Alzheimer disease Assessment & Plan: Discussed family history as  outlined. Request referral  to neurology for further evaluation.   Orders: -     Ambulatory referral to Neurology  Change in hearing, unspecified laterality Assessment & Plan: Discussed hearing loss and need for hearing evaluation. Discussed hearing loss and effects on memory. Agreeable - ENT referral.   Orders: -     Ambulatory referral to ENT     Allena Hamilton, MD

## 2024-01-09 ENCOUNTER — Encounter: Payer: Self-pay | Admitting: Internal Medicine

## 2024-01-09 DIAGNOSIS — Z820 Family history of epilepsy and other diseases of the nervous system: Secondary | ICD-10-CM | POA: Insufficient documentation

## 2024-01-09 DIAGNOSIS — H9190 Unspecified hearing loss, unspecified ear: Secondary | ICD-10-CM | POA: Insufficient documentation

## 2024-01-09 NOTE — Assessment & Plan Note (Signed)
 Low-carb diet and exercise.  Follow met b and A1c.

## 2024-01-09 NOTE — Assessment & Plan Note (Signed)
 Discussed family history as outlined. Request referral to neurology for further evaluation.

## 2024-01-09 NOTE — Assessment & Plan Note (Signed)
 Discussed hearing loss and need for hearing evaluation. Discussed hearing loss and effects on memory. Agreeable - ENT referral.

## 2024-01-09 NOTE — Assessment & Plan Note (Signed)
 Had f/u with pulmonary - 12/06/23 - f/u regarding persistent throat irritation and chronic cough. Also has a history of asthma-like symptoms that per report responds to steroids. His past workup includes a CT scan from June 2025 showing tiny right upper lobe nodules and small areas of scarring. A previous CT scan from September 2024 was also mentioned. Pulmonary function tests in May 2025 showed normal spirometry and lung volumes, with an elevated DLCO at 118%. Allergy testing showed positive skin tests. 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 Also recommended prednisone 5mg  daily and dupixent. Received first dose 12/29/23. Discussed previous w/up. Reviewed previous ENT evaluation. Discussed f/u with  ENT- question of need for laryngoscopy. Also, contacted GI. He is scheduled for colonoscopy 02/2024 and was referred for question of EGD. GI to review to see if needed, will do both procedures at the same time.

## 2024-01-09 NOTE — Assessment & Plan Note (Signed)
 On simvastatin. Continue diet and exercise. Follow lipid panel.

## 2024-01-13 ENCOUNTER — Other Ambulatory Visit: Payer: Self-pay | Admitting: Internal Medicine

## 2024-01-13 ENCOUNTER — Telehealth: Payer: Self-pay

## 2024-01-13 MED ORDER — SIMVASTATIN 40 MG PO TABS: 40.0000 mg | ORAL_TABLET | Freq: Every day | ORAL | 1 refills | Status: DC

## 2024-01-13 NOTE — Telephone Encounter (Signed)
 Rx ok'd for simvasttin.

## 2024-01-13 NOTE — Telephone Encounter (Signed)
 Copied from CRM #[id number]. Topic: General - Other >> Jan 13, 2024  9:00 AM Randall Montgomery wrote: Reason for CRM: Patient has a referral for a colonoscopy that was cancelled, and patient states the pcp is to reschedule it,and would like a call back concerning this.

## 2024-01-13 NOTE — Telephone Encounter (Signed)
 Copied from CRM [id number]. Topic: Clinical - Medication Refill >> Jan 13, 2024  9:03 AM Randall Montgomery wrote: Medication: simvastatin (ZOCOR) 40 MG tablet  Has the patient contacted their pharmacy? Yes (Agent: If no, request that the patient contact the pharmacy for the refill. If patient does not wish to contact the pharmacy document the reason why and proceed with request.) (Agent: If yes, when and what did the pharmacy advise?)  This is the patient's preferred pharmacy:  [id number] - [place], [place] - [address] AT [facility] [address] [facility] [address] [place] [address] Phone: [phone] Fax: [phone]  Is this the correct pharmacy for this prescription? Yes If no, delete pharmacy and type the correct one.   Has the prescription been filled recently? No  Is the patient out of the medication? No  Has the patient been seen for an appointment in the last year OR does the patient have an upcoming appointment? Yes  Can we respond through MyChart? Yes  Agent: Please be advised that Rx refills may take up to 3 business days. We ask that you follow-up with your pharmacy.

## 2024-01-14 ENCOUNTER — Other Ambulatory Visit: Payer: Self-pay | Admitting: Gastroenterology

## 2024-01-17 ENCOUNTER — Other Ambulatory Visit

## 2024-01-17 ENCOUNTER — Telehealth: Payer: Self-pay | Admitting: Internal Medicine

## 2024-01-17 DIAGNOSIS — Z125 Encounter for screening for malignant neoplasm of prostate: Secondary | ICD-10-CM

## 2024-01-17 DIAGNOSIS — R739 Hyperglycemia, unspecified: Secondary | ICD-10-CM | POA: Diagnosis not present

## 2024-01-17 DIAGNOSIS — E7800 Pure hypercholesterolemia, unspecified: Secondary | ICD-10-CM

## 2024-01-17 LAB — CBC WITH DIFFERENTIAL/PLATELET
Basophils Absolute: 0 K/uL (ref 0.0–0.1)
Basophils Relative: 0.7 % (ref 0.0–3.0)
Eosinophils Absolute: 0 K/uL (ref 0.0–0.7)
Eosinophils Relative: 0.5 % (ref 0.0–5.0)
HCT: 44.6 % (ref 39.0–52.0)
Hemoglobin: 14.8 g/dL (ref 13.0–17.0)
Lymphocytes Relative: 35.6 % (ref 12.0–46.0)
Lymphs Abs: 1.5 K/uL (ref 0.7–4.0)
MCHC: 33.1 g/dL (ref 30.0–36.0)
MCV: 96.8 fl (ref 78.0–100.0)
Monocytes Absolute: 0.3 K/uL (ref 0.1–1.0)
Monocytes Relative: 6.6 % (ref 3.0–12.0)
Neutro Abs: 2.4 K/uL (ref 1.4–7.7)
Neutrophils Relative %: 56.6 % (ref 43.0–77.0)
Platelets: 169 K/uL (ref 150.0–400.0)
RBC: 4.61 Mil/uL (ref 4.22–5.81)
RDW: 14.4 % (ref 11.5–15.5)
WBC: 4.3 K/uL (ref 4.0–10.5)

## 2024-01-17 LAB — TSH: TSH: 2.2 u[IU]/mL (ref 0.35–5.50)

## 2024-01-17 LAB — LIPID PANEL
Cholesterol: 206 mg/dL — ABNORMAL HIGH (ref 0–200)
HDL: 72 mg/dL (ref >39.00)
LDL Cholesterol: 114 mg/dL — ABNORMAL HIGH (ref 0–99)
NonHDL: 133.52
Total CHOL/HDL Ratio: 3
Triglycerides: 98 mg/dL (ref 0.0–149.0)
VLDL: 19.6 mg/dL (ref 0.0–40.0)

## 2024-01-17 LAB — BASIC METABOLIC PANEL WITH GFR
BUN: 20 mg/dL (ref 6–23)
CO2: 31 meq/L (ref 19–32)
Calcium: 9 mg/dL (ref 8.4–10.5)
Chloride: 103 meq/L (ref 96–112)
Creatinine, Ser: 0.82 mg/dL (ref 0.40–1.50)
GFR: 87.25 mL/min (ref >60.00)
Glucose, Bld: 87 mg/dL (ref 70–99)
Potassium: 4.1 meq/L (ref 3.5–5.1)
Sodium: 142 meq/L (ref 135–145)

## 2024-01-17 LAB — HEMOGLOBIN A1C: Hgb A1c MFr Bld: 5.8 % (ref 4.6–6.5)

## 2024-01-17 LAB — PSA, MEDICARE: PSA: 2.12 ng/mL (ref 0.10–4.00)

## 2024-01-17 NOTE — Telephone Encounter (Signed)
 Pt had some questions about his colonoscopy and wanting a nurse to give him a call. Please advise.

## 2024-01-18 ENCOUNTER — Ambulatory Visit (INDEPENDENT_AMBULATORY_CARE_PROVIDER_SITE_OTHER)

## 2024-01-18 ENCOUNTER — Ambulatory Visit: Payer: Self-pay | Admitting: Internal Medicine

## 2024-01-18 DIAGNOSIS — E7800 Pure hypercholesterolemia, unspecified: Secondary | ICD-10-CM

## 2024-01-18 DIAGNOSIS — R739 Hyperglycemia, unspecified: Secondary | ICD-10-CM

## 2024-01-18 DIAGNOSIS — Z125 Encounter for screening for malignant neoplasm of prostate: Secondary | ICD-10-CM

## 2024-01-18 LAB — HEPATIC FUNCTION PANEL
ALT: 16 U/L (ref 0–53)
AST: 19 U/L (ref 0–37)
Albumin: 4.3 g/dL (ref 3.5–5.2)
Alkaline Phosphatase: 60 U/L (ref 39–117)
Bilirubin, Direct: 0.1 mg/dL (ref 0.0–0.3)
Total Bilirubin: 0.9 mg/dL (ref 0.2–1.2)
Total Protein: 6.4 g/dL (ref 6.0–8.3)

## 2024-01-18 MED ORDER — ROSUVASTATIN CALCIUM 20 MG PO TABS: 20.0000 mg | ORAL_TABLET | Freq: Every day | ORAL | 1 refills | Status: AC

## 2024-01-18 NOTE — Addendum Note (Signed)
 Addended by: TANDA HARVEY D on: 01/18/2024 10:10 AM   Modules accepted: Orders

## 2024-01-18 NOTE — Telephone Encounter (Signed)
 I notified him that I spoke to [facility] in GI (who consulted with Dr Therisa). Plan for EGD and colonoscopy - per schedule. Informed Mr Trejos to contact GI.

## 2024-01-18 NOTE — Telephone Encounter (Signed)
 Rx sent in for crestor . He will need to stop simvastatin . Start crewstor. Given he has been on simvastatin  and we are just changing cholesterol medication, he does not need liver panel checked. Thanks.

## 2024-01-19 ENCOUNTER — Ambulatory Visit: Payer: Self-pay | Admitting: Internal Medicine

## 2024-01-31 ENCOUNTER — Ambulatory Visit: Payer: Self-pay

## 2024-01-31 ENCOUNTER — Telehealth: Admitting: Family Medicine

## 2024-01-31 DIAGNOSIS — R509 Fever, unspecified: Secondary | ICD-10-CM

## 2024-01-31 DIAGNOSIS — J4541 Moderate persistent asthma with (acute) exacerbation: Secondary | ICD-10-CM

## 2024-01-31 MED ORDER — ALBUTEROL SULFATE HFA 108 (90 BASE) MCG/ACT IN AERS: 2.0000 | INHALATION_SPRAY | Freq: Four times a day (QID) | RESPIRATORY_TRACT | 0 refills | Status: AC | PRN

## 2024-01-31 MED ORDER — PREDNISONE 20 MG PO TABS: 20.0000 mg | ORAL_TABLET | Freq: Two times a day (BID) | ORAL | 0 refills | Status: AC

## 2024-01-31 MED ORDER — PROMETHAZINE-DM 6.25-15 MG/5ML PO SYRP: 5.0000 mL | ORAL_SOLUTION | Freq: Four times a day (QID) | ORAL | 0 refills | Status: AC | PRN

## 2024-01-31 NOTE — Progress Notes (Signed)
 " Virtual Visit Consent   Randall Montgomery, you are scheduled for a virtual visit with a [facility] provider today. Just as with appointments in the office, your consent must be obtained to participate. Your consent will be active for this visit and any virtual visit you may have with one of our providers in the next 365 days. If you have a MyChart account, a copy of this consent can be sent to you electronically.  As this is a virtual visit, video technology does not allow for your provider to perform a traditional examination. This may limit your provider's ability to fully assess your condition. If your provider identifies any concerns that need to be evaluated in person or the need to arrange testing (such as labs, EKG, etc.), we will make arrangements to do so. Although advances in technology are sophisticated, we cannot ensure that it will always work on either your end or our end. If the connection with a video visit is poor, the visit may have to be switched to a telephone visit. With either a video or telephone visit, we are not always able to ensure that we have a secure connection.  By engaging in this virtual visit, you consent to the provision of healthcare and authorize for your insurance to be billed (if applicable) for the services provided during this visit. Depending on your insurance coverage, you may receive a charge related to this service.  I need to obtain your verbal consent now. Are you willing to proceed with your visit today? Randall Montgomery has provided verbal consent on 01/31/2024 for a virtual visit (video or telephone). Randall Lamp, FNP  Date: 01/31/2024 5:39 PM   Virtual Visit via Video Note   I, Randall Montgomery, connected with  Randall Montgomery  ([id number], 1950-08-19) on 01/31/2024 at  5:30 PM EST by a video-enabled telemedicine application and verified that I am speaking with the correct person using two identifiers.  Location: Patient: Virtual Visit Location Patient:  Home Provider: Virtual Visit Location Provider: Home Office   I discussed the limitations of evaluation and management by telemedicine and the availability of in person appointments. The patient expressed understanding and agreed to proceed.    History of Present Illness: Randall Montgomery is a 73 y.o. who identifies as a male who was assigned male at birth, and is being seen today for cough x 10 days, temp 102 today. Mucus yellow. Fever started yesterday. Wheezing at times. No sob. Head congestion. Chest and back hurt with cough. No covid or flu testing. He has a history of asthmatic bronchitis and pneumonia.   HPI: HPI  Problems:  Patient Active Problem List   Diagnosis Date Noted   Family history of Alzheimer disease 01/09/2024   Change in hearing 01/09/2024   Plantar fasciitis 10/11/2023   Throat clearing 10/11/2023   History of ulcer disease 10/11/2023   Hypercholesterolemia 10/05/2023   Hyperglycemia 10/05/2023    Allergies: Allergies[1] Medications: Current Medications[2]  Observations/Objective: Patient is well-developed, well-nourished in no acute distress.  Resting comfortably  at home.  Head is normocephalic, atraumatic.  No labored breathing.  Speech is clear and coherent with logical content.  Patient is alert and oriented at baseline.    Assessment and Plan: 1. Moderate persistent asthmatic bronchitis with acute exacerbation (Primary)  Increase fluids, humidifier at night, UC if sx persist or worsen.   Follow Up Instructions: I discussed the assessment and treatment plan with the patient. The patient was provided an opportunity to ask questions and  all were answered. The patient agreed with the plan and demonstrated an understanding of the instructions.  A copy of instructions were sent to the patient via MyChart unless otherwise noted below.     The patient was advised to call back or seek an in-person evaluation if the symptoms worsen or if the condition fails to  improve as anticipated.    Randall Steedman, FNP     [1]  Allergies Allergen Reactions   Penicillins Rash  [2]  Current Outpatient Medications:    albuterol  (VENTOLIN  HFA) 108 (90 Base) MCG/ACT inhaler, Inhale 2 puffs into the lungs every 6 (six) hours as needed for wheezing or shortness of breath., Disp: 8.5 g, Rfl: 0   levofloxacin (LEVAQUIN) 500 MG tablet, Take 1 tablet (500 mg total) by mouth daily for 10 days., Disp: 10 tablet, Rfl: 0   predniSONE  (DELTASONE ) 20 MG tablet, Take 1 tablet (20 mg total) by mouth 2 (two) times daily with a meal for 5 days., Disp: 10 tablet, Rfl: 0   promethazine -dextromethorphan (PROMETHAZINE -DM) 6.25-15 MG/5ML syrup, Take 5 mLs by mouth 4 (four) times daily as needed for up to 10 days for cough., Disp: 118 mL, Rfl: 0   ascorbic acid (VITAMIN C) 500 MG tablet, Take 500 mg by mouth daily., Disp: , Rfl:    Cholecalciferol (D 1000) 25 MCG (1000 UT) capsule, Take 1,000 Units by mouth daily., Disp: , Rfl:    Potassium Citrate,Elemental K, 99 MG CAPS, Take 1 capsule by mouth daily., Disp: , Rfl:    rosuvastatin  (CRESTOR ) 20 MG tablet, Take 1 tablet (20 mg total) by mouth daily., Disp: 90 tablet, Rfl: 1   traZODone (DESYREL) 50 MG tablet, Take by mouth., Disp: , Rfl:   "

## 2024-01-31 NOTE — Patient Instructions (Signed)

## 2024-01-31 NOTE — Telephone Encounter (Signed)
 FYI Only or Action Required?: FYI only for provider: appointment scheduled on 01/31/24 via virtual appt.  Patient was last seen in primary care on 01/03/2024 by Glendia Shad, MD.  Called Nurse Triage reporting Fever.  Symptoms began a week ago.  Interventions attempted: OTC medications: mucinex Dm and Rest, hydration, or home remedies.  Symptoms are: gradually worsening.  Triage Disposition: See HCP Within 4 Hours (Or PCP Triage)  Patient/caregiver understands and will follow disposition?: Yes   Copied from CRM [id number]. Topic: Clinical - Red Word Triage >> Jan 31, 2024  4:02 PM Shereese L wrote: [facility] that prompted transfer to Nurse Triage: temp of 102, colored mucus, wet cough, fatigue    Reason for Disposition  [1] Fever > 101 F (38.3 C) AND [2] age > 60 years  Answer Assessment - Initial Assessment Questions Pt called in to report fever of 102F today after experiencing cold/flu symptoms x 10 days. Pt reports yellow colored mucous, wet cough, fatigue and -6Ibs d/t lack of appetite. Denies n/v but diarrhea x1. Pt has tried otc mucinex-dm and cough medication without relief. Pt reports he has not been tested for flu/COVID/strep as he is up to date on vaccines; discussed possibility of still getting viruses if exposed. Discussed importance of PO intake for energy to fight virus; pt reports he is taking in fluids. Pt worried d/t hx of sinus infections with URI. Discussed no appts in office until 12/24. Pt agreeable to UC virtual visit via my chart. Appointment scheduled for evaluation. Patient agrees with plan of care, and will call back if anything changes, or if symptoms worsen.      1. TEMPERATURE: What is the most recent temperature?  How was it measured?      102F; reports highest fever   2. ONSET: When did the fever start?      10 days ago   4. OTHER SYMPTOMS: Do you have any other symptoms besides the fever?  (e.g., abdomen pain, cough, diarrhea, earache,  headache, sore throat, urination pain)     Yellow mucous, wet cough, fatigue; lack of appetite, -6 Ibs; diarrhea x1   6. CONTACTS: Does anyone else in the family have an infection?     No   7. TREATMENT: What have you done so far to treat this fever? (e.g., OTC fever medicines)     Mucinex DM; cold and flu DM; fluids  Protocols used: Fever-A-AH

## 2024-02-07 ENCOUNTER — Ambulatory Visit: Payer: Self-pay

## 2024-02-07 NOTE — Telephone Encounter (Signed)
 FYI Only or Action Required?: FYI only for provider: appointment scheduled on 12.30.25.  Patient was last seen in primary care on 01/31/2024 by Blair, Diane W, FNP.  Called Nurse Triage reporting Cough.  Symptoms began a week ago.  Interventions attempted: Prescription medications: promethazine , levofloxacin, and prednisone .  Symptoms are: unchanged.  Triage Disposition: See HCP Within 4 Hours (Or PCP Triage)  Patient/caregiver understands and will follow disposition?: Yes   Copied from CRM #[id number]. Topic: Clinical - Red Word Triage >> Feb 07, 2024 10:47 AM Zy'onna H wrote: [facility] that prompted transfer to Nurse Triage:  Patient has stated the following symptoms:    Constant cough  Chest Congestion Mucus/Phlegm   Last Few days he had a Fever Has been seen last week, virtual appt.  He was seen previously - 01/03/2024; he stated he has completed all medications given and they've seemed to not resolve his symptoms.  Looking to be seen again, it can be virtual or in-office(preferred) Reason for Disposition  Wheezing is present  Answer Assessment - Initial Assessment Questions 1. ONSET: When did the cough begin?       Over one week  2. SEVERITY: How bad is the cough today?       Worsened since onset  3. SPUTUM: Describe the color of your sputum (e.g., none, dry cough; clear, white, yellow, green)      clear  4. HEMOPTYSIS: Are you coughing up any blood? If Yes, ask: How much? (e.g., flecks, streaks, tablespoons, etc.)      Denies  5. DIFFICULTY BREATHING: Are you having difficulty breathing? If Yes, ask: How bad is it? (e.g., mild, moderate, severe)       Denies  6. FEVER: Do you have a fever? If Yes, ask: What is your temperature, how was it measured, and when did it start?      Occasional fever  7. CARDIAC HISTORY: Do you have any history of heart disease? (e.g., heart attack, congestive heart failure)           Per pt's chart, pt does not have  cardiac hx  8. LUNG HISTORY: Do you have any history of lung disease?  (e.g., pulmonary embolus, asthma, emphysema)      Per pt's chart, pt does not have lung hx  9. PE RISK FACTORS: Do you have a history of blood clots? (or: recent major surgery, recent prolonged travel, bedridden)      Per pt's chart, pt does not have PE risk factors  10. OTHER SYMPTOMS: Pt denies chest pain        Reports runny nose and wheezing   Pt reports cough, congestion, mucus Pt is taking Prednisone , finished today, promethazine , Levofloxacin. Pt states symptoms have not gotten better Pt scheduled for a visit on 12.30.25  for further evaluation. Pt agrees with plan of care, will call back for any worsening symptoms  Protocols used: Cough - Acute Productive-A-AH

## 2024-02-07 NOTE — Telephone Encounter (Signed)
 PAS unable to connect to nurse. Placing in call backs.   Copied from CRM #[id number]. Topic: Clinical - Red Word Triage >> Feb 07, 2024 10:47 AM Zy'onna H wrote: [facility] that prompted transfer to Nurse Triage:  Patient has stated the following symptoms:   Constant cough  Chest Congestion Mucus/Phlegm  Last Few days he had a Fever Has been seen last week, virtual appt.  He was seen previously - 01/03/2024; he stated he has completed all medications given and they've seemed to not resolve his symptoms.  Looking to be seen again, it can be virtual or in-office(preferred)

## 2024-02-08 ENCOUNTER — Ambulatory Visit (INDEPENDENT_AMBULATORY_CARE_PROVIDER_SITE_OTHER): Admitting: Family

## 2024-02-08 ENCOUNTER — Encounter: Payer: Self-pay | Admitting: Family

## 2024-02-08 VITALS — BP 92/60 | HR 76 | Temp 97.7°F | Ht 72.0 in | Wt 150.6 lb

## 2024-02-08 DIAGNOSIS — B9689 Other specified bacterial agents as the cause of diseases classified elsewhere: Secondary | ICD-10-CM | POA: Diagnosis not present

## 2024-02-08 DIAGNOSIS — H6121 Impacted cerumen, right ear: Secondary | ICD-10-CM | POA: Diagnosis not present

## 2024-02-08 DIAGNOSIS — R339 Retention of urine, unspecified: Secondary | ICD-10-CM

## 2024-02-08 DIAGNOSIS — J019 Acute sinusitis, unspecified: Secondary | ICD-10-CM | POA: Diagnosis not present

## 2024-02-08 DIAGNOSIS — R350 Frequency of micturition: Secondary | ICD-10-CM

## 2024-02-08 MED ORDER — DOXYCYCLINE HYCLATE 100 MG PO TABS: 100.0000 mg | ORAL_TABLET | Freq: Two times a day (BID) | ORAL | 0 refills | Status: AC

## 2024-02-08 NOTE — Progress Notes (Signed)
 "  Acute Office Visit  Subjective:     Patient ID: Randall Montgomery, male    DOB: 26-Sep-1950, 73 y.o.   MRN: [id number]  Chief Complaint  Patient presents with   Cough   Ear Problem   Urinary Tract Infection   Urinary Retention    HPI Patient is in today with persistent complaints of cough, congestion, runny nose with yellow phlegm.  Was seen by e-visit on 01/31/2024 and was prescribed a steroid.  He feels like he did get some better but symptoms have not resolved.  Denies any fever or chills.  Does report right ear fullness.  Patient also has concerns of urinary frequency, burning with urination, and urgency.  The symptoms are worse at night.  He is concerned about having a urinary tract infection.  Married.  No concerns of STIs.  ROS Past Medical History:  Diagnosis Date   Allergy 1960   Penicillin   Basal cell carcinoma    Hypercholesterolemia     Social History   Socioeconomic History   Marital status: Married    Spouse name: Not on file   Number of children: Not on file   Years of education: Not on file   Highest education level: Master's degree (e.g., MA, MS, MEng, MEd, MSW, MBA)  Occupational History   Not on file  Tobacco Use   Smoking status: Never   Smokeless tobacco: Never  Substance and Sexual Activity   Alcohol use: Yes    Alcohol/week: 2.0 standard drinks of alcohol    Types: 2 Cans of beer per week   Drug use: Never   Sexual activity: Yes    Birth control/protection: Post-menopausal    Comment: With wife only  Other Topics Concern   Not on file  Social History Narrative   Not on file   Social Drivers of Health   Tobacco Use: Low Risk (02/08/2024)   Patient History    Smoking Tobacco Use: Never    Smokeless Tobacco Use: Never    Passive Exposure: Not on file  Financial Resource Strain: Low Risk (12/31/2023)   Overall Financial Resource Strain (CARDIA)    Difficulty of Paying Living Expenses: Not hard at all  Food  Insecurity: No Food Insecurity (12/31/2023)   Epic    Worried About Programme Researcher, Broadcasting/film/video in the Last Year: Never true    Ran Out of Food in the Last Year: Never true  Transportation Needs: No Transportation Needs (12/31/2023)   Epic    Lack of Transportation (Medical): No    Lack of Transportation (Non-Medical): No  Physical Activity: Sufficiently Active (12/31/2023)   Exercise Vital Sign    Days of Exercise per Week: 5 days    Minutes of Exercise per Session: 60 min  Stress: Stress Concern Present (12/31/2023)   [employer] of Occupational Health - Occupational Stress Questionnaire    Feeling of Stress: To some extent  Social Connections: Socially Integrated (12/31/2023)   Social Connection and Isolation Panel    Frequency of Communication with Friends and Family: Three times a week    Frequency of Social Gatherings with Friends and Family: Twice a week    Attends Religious Services: More than 4 times per year    Active Member of Clubs or Organizations: Yes    Attends Banker Meetings: More than 4 times per year    Marital Status: Married  Catering Manager Violence: Not on file  Depression (PHQ2-9): Low Risk (02/08/2024)   Depression (PHQ2-9)  PHQ-2 Score: 1  Alcohol Screen: Low Risk (12/31/2023)   Alcohol Screen    Last Alcohol Screening Score (AUDIT): 3  Housing: Low Risk (12/31/2023)   Epic    Unable to Pay for Housing in the Last Year: No    Number of Times Moved in the Last Year: 0    Homeless in the Last Year: No  Utilities: Not At Risk (10/07/2022)   Received from [facility] Utilities    Threatened with loss of utilities: No  Health Literacy: Not on file    Past Surgical History:  Procedure Laterality Date   EYE SURGERY  11/10/2022   Torn retina-fully repaired   HERNIA REPAIR  2000 and 2015?   No problems sionce    Family History  Problem Relation Age of Onset   Cancer Sister      Allergies[1]  Medications Ordered Prior to Encounter[2]  BP 92/60 (BP Location: Right Arm, Patient Position: Sitting, Cuff Size: Normal)   Pulse 76   Temp 97.7 F (36.5 C) (Oral)   Ht 6' (1.829 m)   Wt 150 lb 9.6 oz (68.3 kg)   SpO2 96%   BMI 20.43 kg/m chart      Objective:    BP 92/60 (BP Location: Right Arm, Patient Position: Sitting, Cuff Size: Normal)   Pulse 76   Temp 97.7 F (36.5 C) (Oral)   Ht 6' (1.829 m)   Wt 150 lb 9.6 oz (68.3 kg)   SpO2 96%   BMI 20.43 kg/m    Physical Exam Vitals reviewed.  Constitutional:      Appearance: Normal appearance. He is normal weight.  HENT:     Right Ear: There is impacted cerumen.     Left Ear: Tympanic membrane, ear canal and external ear normal.     Ears:     Comments: Right ear: Informed consent was obtained and peroxide gel was inserted into the ears bilaterally using the lavage kit the ears were lavaged until clean.Inspection with a cerumen spoon removed residual wax. Patient tolerated the procedure well.  Tympanic membrane normal    Nose: Congestion and rhinorrhea present.  Cardiovascular:     Rate and Rhythm: Normal rate and regular rhythm.     Pulses: Normal pulses.     Heart sounds: Normal heart sounds.  Abdominal:     General: Abdomen is flat. Bowel sounds are normal.     Palpations: Abdomen is soft.  Musculoskeletal:        General: Normal range of motion.     Cervical back: Normal range of motion and neck supple.  Skin:    General: Skin is warm and dry.  Neurological:     General: No focal deficit present.     Mental Status: He is alert and oriented to person, place, and time. Mental status is at baseline.  Psychiatric:        Mood and Affect: Mood normal.        Behavior: Behavior normal.        Thought Content: Thought content normal.        Judgment: Judgment normal.     No results found for any visits on 02/08/24.      Assessment & Plan:   Problem List Items Addressed This Visit    None Visit Diagnoses       Acute bacterial sinusitis    -  Primary   Relevant Medications   doxycycline (VIBRA-TABS) 100 MG tablet  Urinary retention       Relevant Orders   Urine Culture   Urinalysis, Routine w reflex microscopic     Urinary frequency       Relevant Orders   Urine Culture   Urinalysis, Routine w reflex microscopic     Right ear impacted cerumen           Meds ordered this encounter  Medications   doxycycline (VIBRA-TABS) 100 MG tablet    Sig: Take 1 tablet (100 mg total) by mouth 2 (two) times daily.    Dispense:  20 tablet    Refill:  0   Call the office if symptoms worsen or persist.  Recheck as scheduled and sooner as needed.  Will follow-up pending the results of urine culture. No follow-ups on file.  Cainan Trull B Kiffany Schelling, FNP       [1] Allergies Allergen Reactions   Penicillins Rash  [2] Current Outpatient Medications on File Prior to Visit  Medication Sig Dispense Refill   albuterol  (VENTOLIN  HFA) 108 (90 Base) MCG/ACT inhaler Inhale 2 puffs into the lungs every 6 (six) hours as needed for wheezing or shortness of breath. 8.5 g 0   ascorbic acid (VITAMIN C) 500 MG tablet Take 500 mg by mouth daily.     Cholecalciferol (D 1000) 25 MCG (1000 UT) capsule Take 1,000 Units by mouth daily.     Dupilumab 300 MG/2ML SOAJ Inject 300 mg into the skin.     EPINEPHrine 0.3 mg/0.3 mL IJ SOAJ injection SMARTSIG:0.3 Milliliter(s) IM Once PRN     fluticasone-salmeterol (ADVAIR HFA) 230-21 MCG/ACT inhaler Inhale 2 puffs into the lungs.     Potassium Citrate,Elemental K, 99 MG CAPS Take 1 capsule by mouth daily.     rosuvastatin  (CRESTOR ) 20 MG tablet Take 1 tablet (20 mg total) by mouth daily. 90 tablet 1   traZODone (DESYREL) 50 MG tablet Take by mouth.     levofloxacin (LEVAQUIN) 500 MG tablet Take 1 tablet (500 mg total) by mouth daily for 10 days. (Patient not taking: Reported on 02/08/2024) 10 tablet 0   promethazine -dextromethorphan  (PROMETHAZINE -DM) 6.25-15 MG/5ML syrup Take 5 mLs by mouth 4 (four) times daily as needed for up to 10 days for cough. (Patient not taking: Reported on 02/08/2024) 118 mL 0   No current facility-administered medications on file prior to visit.  "

## 2024-03-01 ENCOUNTER — Encounter
Admission: RE | Disposition: A | Discharge status: Home / Self Care | Payer: Self-pay | Source: Home / Self Care | Attending: Gastroenterology

## 2024-03-01 ENCOUNTER — Ambulatory Visit: Admitting: Certified Registered"

## 2024-03-01 ENCOUNTER — Ambulatory Visit
Admission: RE | Admit: 2024-03-01 | Discharge: 2024-03-01 | Disposition: A | Discharge status: Home / Self Care | Attending: Gastroenterology | Admitting: Gastroenterology

## 2024-03-01 ENCOUNTER — Encounter: Payer: Self-pay | Admitting: Gastroenterology

## 2024-03-01 HISTORY — PX: COLONOSCOPY: SHX5424

## 2024-03-01 HISTORY — PX: POLYPECTOMY: SHX149

## 2024-03-01 HISTORY — PX: ESOPHAGOGASTRODUODENOSCOPY: SHX5428

## 2024-03-01 MED ORDER — GLYCOPYRROLATE 0.2 MG/ML IJ SOLN
INTRAMUSCULAR | Status: DC | PRN
  Administered 2024-03-01 (×2): .2 mg via INTRAVENOUS

## 2024-03-01 MED ORDER — LIDOCAINE HCL (CARDIAC) PF 100 MG/5ML IV SOSY
PREFILLED_SYRINGE | INTRAVENOUS | Status: DC | PRN
  Administered 2024-03-01: 100 mg via INTRAVENOUS

## 2024-03-01 MED ORDER — SODIUM CHLORIDE 0.9 % IV SOLN
INTRAVENOUS | Status: DC
  Administered 2024-03-01: 20 mL/h via INTRAVENOUS

## 2024-03-01 MED ORDER — EPHEDRINE SULFATE-NACL 50-0.9 MG/10ML-% IV SOSY
PREFILLED_SYRINGE | INTRAVENOUS | Status: DC | PRN
  Administered 2024-03-01: 5 mg via INTRAVENOUS

## 2024-03-01 MED ORDER — PHENYLEPHRINE HCL (PRESSORS) 10 MG/ML IV SOLN
INTRAVENOUS | Status: DC | PRN
  Administered 2024-03-01: 160 ug via INTRAVENOUS

## 2024-03-01 MED ORDER — DEXMEDETOMIDINE HCL IN NACL 200 MCG/50ML IV SOLN
INTRAVENOUS | Status: DC | PRN
  Administered 2024-03-01: 12 ug via INTRAVENOUS

## 2024-03-01 MED ORDER — PROPOFOL 10 MG/ML IV BOLUS
INTRAVENOUS | Status: DC | PRN
  Administered 2024-03-01: 60 mg via INTRAVENOUS

## 2024-03-01 MED ORDER — PROPOFOL 500 MG/50ML IV EMUL
INTRAVENOUS | Status: DC | PRN
  Administered 2024-03-01: 155 ug/kg/min via INTRAVENOUS

## 2024-03-01 NOTE — Op Note (Signed)
 [facility] Gastroenterology Patient Name: Toddy Boyd Procedure Date: 03/01/2024 10:16 AM MRN: [id number] Account #: [id number] Date of Birth: 1950/07/09 Admit Type: Outpatient Age: 74 Room: [facility] ENDO ROOM 3 Gender: Male Note Status: Finalized Instrument Name: Colon Scope [id number] Procedure:             Colonoscopy Indications:           Screening in patient at increased risk: Family history                         of 1st-degree relative with colorectal cancer Providers:             Ruel Kung MD, MD Referring MD:          Allena Hamilton, MD (Referring MD) Medicines:             Monitored Anesthesia Care Complications:         No immediate complications. Procedure:             Pre-Anesthesia Assessment:                        - Prior to the procedure, a History and Physical was                         performed, and patient medications, allergies and                         sensitivities were reviewed. The patient's tolerance                         of previous anesthesia was reviewed.                        - The risks and benefits of the procedure and the                         sedation options and risks were discussed with the                         patient. All questions were answered and informed                         consent was obtained.                        - ASA Grade Assessment: II - A patient with mild                         systemic disease.                        After obtaining informed consent, the colonoscope was                         passed under direct vision. Throughout the procedure,                         the patient's blood pressure, pulse, and oxygen  saturations were monitored continuously. The                         Colonoscope was introduced through the anus and                         advanced to the the cecum, identified by the                         appendiceal orifice. The colonoscopy was performed                          with ease. The patient tolerated the procedure well.                         The quality of the bowel preparation was excellent.                         The ileocecal valve, appendiceal orifice, and rectum                         were photographed. Findings:      The perianal and digital rectal examinations were normal.      A 5 mm polyp was found in the proximal ascending colon. The polyp was       sessile. The polyp was removed with a cold snare. Resection was       complete, but the polyp tissue was not retrieved.      Multiple medium-mouthed diverticula were found in the left colon.      The exam was otherwise without abnormality on direct and retroflexion       views. Impression:            - One 5 mm polyp in the proximal ascending colon,                         removed with a cold snare. Complete resection. Polyp                         tissue not retrieved.                        - Diverticulosis in the left colon.                        - The examination was otherwise normal on direct and                         retroflexion views. Recommendation:        - Discharge patient to home (with escort).                        - Resume previous diet.                        - Continue present medications.                        - Repeat colonoscopy is not recommended due to current  age (66 years or older) for surveillance. Procedure Code(s):     --- Professional ---                        [phone], Colonoscopy, flexible; with removal of                         tumor(s), polyp(s), or other lesion(s) by snare                         technique Diagnosis Code(s):     --- Professional ---                        Z80.0, Family history of malignant neoplasm of                         digestive organs                        D12.2, Benign neoplasm of ascending colon                        K57.30, Diverticulosis of large intestine without                          perforation or abscess without bleeding CPT copyright 2022 American Medical Association. All rights reserved. The codes documented in this report are preliminary and upon coder review may  be revised to meet current compliance requirements. Ruel Kung, MD Ruel Kung MD, MD 03/01/2024 10:54:58 AM This report has been signed electronically. Number of Addenda: 0 Note Initiated On: 03/01/2024 10:16 AM Scope Withdrawal Time: 0 hours 11 minutes 28 seconds  Total Procedure Duration: 0 hours 14 minutes 26 seconds  Estimated Blood Loss:  Estimated blood loss: none.      [facility]

## 2024-03-01 NOTE — H&P (Signed)
 "  Ruel Kung , MD [address], [place], [place], [address] Phone: [phone] Fax: [phone]  Primary Care Physician:  Glendia Shad, MD   Pre-Procedure History & Physical: HPI:  Randall Montgomery is a 74 y.o. male is here for an endoscopy and colonoscopy    Past Medical History:  Diagnosis Date   Allergy 1960   Penicillin   Basal cell carcinoma    Hypercholesterolemia     Past Surgical History:  Procedure Laterality Date   EYE SURGERY  11/10/2022   Torn retina-fully repaired   HERNIA REPAIR  2000 and 2015?   No problems sionce    Prior to Admission medications  Medication Sig Start Date End Date Taking? Authorizing Provider  albuterol  (VENTOLIN  HFA) 108 (90 Base) MCG/ACT inhaler Inhale 2 puffs into the lungs every 6 (six) hours as needed for wheezing or shortness of breath. 01/31/24  Yes Blair, Diane W, FNP  ascorbic acid (VITAMIN C) 500 MG tablet Take 500 mg by mouth daily.   Yes [provider]  Cholecalciferol (D 1000) 25 MCG (1000 UT) capsule Take 1,000 Units by mouth daily.   Yes [provider]  doxycycline  (VIBRA -TABS) 100 MG tablet Take 1 tablet (100 mg total) by mouth 2 (two) times daily. 02/08/24  Yes Douglass Caul B, FNP  Dupilumab 300 MG/2ML SOAJ Inject 300 mg into the skin. 12/17/23  Yes [provider]  EPINEPHrine 0.3 mg/0.3 mL IJ SOAJ injection SMARTSIG:0.3 Milliliter(s) IM Once PRN 12/17/23  Yes [provider]  fluticasone-salmeterol (ADVAIR HFA) 230-21 MCG/ACT inhaler Inhale 2 puffs into the lungs. 12/06/23 12/05/24 Yes [provider]  Potassium Citrate,Elemental K, 99 MG CAPS Take 1 capsule by mouth daily.   Yes [provider]  rosuvastatin  (CRESTOR ) 20 MG tablet Take 1 tablet (20 mg total) by mouth daily. 01/18/24  Yes Glendia Shad, MD  traZODone (DESYREL) 50 MG tablet Take by mouth. 07/14/21  Yes [provider]    Allergies as of 12/04/2023 - Review Complete 10/05/2023   Allergen Reaction Noted   Penicillins Rash 04/21/2022    Family History  Problem Relation Age of Onset   Cancer Sister     Social History   Socioeconomic History   Marital status: Married    Spouse name: Not on file   Number of children: Not on file   Years of education: Not on file   Highest education level: Master's degree (e.g., MA, MS, MEng, MEd, MSW, MBA)  Occupational History   Not on file  Tobacco Use   Smoking status: Never   Smokeless tobacco: Never  Substance and Sexual Activity   Alcohol use: Yes    Alcohol/week: 2.0 standard drinks of alcohol    Types: 2 Cans of beer per week   Drug use: Never   Sexual activity: Yes    Birth control/protection: Post-menopausal    Comment: With wife only  Other Topics Concern   Not on file  Social History Narrative   Not on file   Social Drivers of Health   Tobacco Use: Low Risk (02/08/2024)   Patient History    Smoking Tobacco Use: Never    Smokeless Tobacco Use: Never    Passive Exposure: Not on file  Financial Resource Strain: Low Risk (12/31/2023)   Overall Financial Resource Strain (CARDIA)    Difficulty of Paying Living Expenses: Not hard at all  Food Insecurity: No Food Insecurity (12/31/2023)   Epic    Worried About Running Out of  Food in the Last Year: Never true    Ran Out of Food in the Last Year: Never true  Transportation Needs: No Transportation Needs (12/31/2023)   Epic    Lack of Transportation (Medical): No    Lack of Transportation (Non-Medical): No  Physical Activity: Sufficiently Active (12/31/2023)   Exercise Vital Sign    Days of Exercise per Week: 5 days    Minutes of Exercise per Session: 60 min  Stress: Stress Concern Present (12/31/2023)   [employer] of Occupational Health - Occupational Stress Questionnaire    Feeling of Stress: To some extent  Social Connections: Socially Integrated (12/31/2023)   Social Connection and Isolation Panel    Frequency of Communication with  Friends and Family: Three times a week    Frequency of Social Gatherings with Friends and Family: Twice a week    Attends Religious Services: More than 4 times per year    Active Member of Clubs or Organizations: Yes    Attends Engineer, Structural: More than 4 times per year    Marital Status: Married  Catering Manager Violence: Not on file  Depression (PHQ2-9): Low Risk (02/08/2024)   Depression (PHQ2-9)    PHQ-2 Score: 1  Alcohol Screen: Low Risk (12/31/2023)   Alcohol Screen    Last Alcohol Screening Score (AUDIT): 3  Housing: Low Risk (12/31/2023)   Epic    Unable to Pay for Housing in the Last Year: No    Number of Times Moved in the Last Year: 0    Homeless in the Last Year: No  Utilities: Not At Risk (10/07/2022)   Received from [facility] Utilities    Threatened with loss of utilities: No  Health Literacy: Not on file    Review of Systems: See HPI, otherwise negative ROS  Physical Exam: There were no vitals taken for this visit. General:   Alert,  pleasant and cooperative in NAD Head:  Normocephalic and atraumatic. Neck:  Supple; no masses or thyromegaly. Lungs:  Clear throughout to auscultation, normal respiratory effort.    Heart:  +S1, +S2, Regular rate and rhythm, No edema. Abdomen:  Soft, nontender and nondistended. Normal bowel sounds, without guarding, and without rebound.   Neurologic:  Alert and  oriented x4;  grossly normal neurologically.  Impression/Plan: Randall Montgomery is here for an endoscopy and colonoscopy  to be performed for  evaluation of GERD, colon cancer screening     Risks, benefits, limitations, and alternatives regarding endoscopy have been reviewed with the patient.  Questions have been answered.  All parties agreeable.   Ruel Kung, MD  03/01/2024, 9:27 AM  "

## 2024-03-01 NOTE — Transfer of Care (Addendum)
 Immediate Anesthesia Transfer of Care Note  Patient: Randall Montgomery  Procedure(s) Performed: COLONOSCOPY EGD (ESOPHAGOGASTRODUODENOSCOPY) POLYPECTOMY, INTESTINE  Patient Location: Endoscopy Unit  Anesthesia Type:General  Level of Consciousness: drowsy and patient cooperative  Airway & Oxygen Therapy: Patient Spontanous Breathing and Patient connected to face mask oxygen  Post-op Assessment: Report given to RN and Post -op Vital signs reviewed and stable  Post vital signs: Reviewed and stable, treated w/neo w/ positive response, recheck 116/71 -will cont to monitor TFH CRNA  Last Vitals:  Vitals Value Taken Time  BP 77/61 03/01/24 10:56  Temp 35.3 C 03/01/24 10:55  Pulse 89 03/01/24 10:56  Resp 22 03/01/24 10:56  SpO2 100 % 03/01/24 10:56  Vitals shown include unfiled device data.  Last Pain:  Vitals:   03/01/24 0927  TempSrc: Temporal  PainSc: 0-No pain         Complications: No notable events documented.

## 2024-03-01 NOTE — Anesthesia Procedure Notes (Signed)
 Procedure Name: General with mask airway Date/Time: 03/01/2024 10:30 AM  Performed by: Ledora Duncan, CRNAPre-anesthesia Checklist: Patient identified, Emergency Drugs available, Suction available and Patient being monitored Patient Re-evaluated:Patient Re-evaluated prior to induction Oxygen Delivery Method: Simple face mask Induction Type: IV induction Placement Confirmation: positive ETCO2 and breath sounds checked- equal and bilateral Dental Injury: Teeth and Oropharynx as per pre-operative assessment

## 2024-03-01 NOTE — Anesthesia Postprocedure Evaluation (Signed)
"   Anesthesia Post Note  Patient: Randall Montgomery  Procedure(s) Performed: COLONOSCOPY EGD (ESOPHAGOGASTRODUODENOSCOPY) POLYPECTOMY, INTESTINE  Patient location during evaluation: PACU Anesthesia Type: General Level of consciousness: awake and alert Pain management: pain level controlled Vital Signs Assessment: post-procedure vital signs reviewed and stable Respiratory status: spontaneous breathing Cardiovascular status: stable Anesthetic complications: no   No notable events documented.   Last Vitals:  Vitals:   03/01/24 1118 03/01/24 1120  BP: 111/77 114/76  Pulse: (!) 33 (!) 50  Resp: 14 18  Temp:    SpO2: 97% 95%    Last Pain:  Vitals:   03/01/24 0927  TempSrc: Temporal  PainSc: 0-No pain                 VAN STAVEREN,[name]      "

## 2024-03-01 NOTE — Op Note (Signed)
 [facility] Patient Name: Randall Montgomery Procedure Date: 03/01/2024 10:17 AM MRN: [id number] Account #: [id number] Date of Birth: May 04, 1950 Admit Type: Outpatient Age: 74 Room: [facility] ENDO ROOM 3 Gender: Male Note Status: Finalized Instrument Name: Upper GI Scope [id number] Procedure:             Upper GI endoscopy Indications:           Follow-up of gastro-esophageal reflux disease Providers:             Ruel Kung MD, MD Referring MD:          Allena Hamilton, MD (Referring MD) Medicines:             Monitored Anesthesia Care Complications:         No immediate complications. Procedure:             Pre-Anesthesia Assessment:                        - Prior to the procedure, a History and Physical was                         performed, and patient medications, allergies and                         sensitivities were reviewed. The patient's tolerance                         of previous anesthesia was reviewed.                        - The risks and benefits of the procedure and the                         sedation options and risks were discussed with the                         patient. All questions were answered and informed                         consent was obtained.                        - ASA Grade Assessment: II - A patient with mild                         systemic disease.                        After obtaining informed consent, the endoscope was                         passed under direct vision. Throughout the procedure,                         the patient's blood pressure, pulse, and oxygen                         saturations were monitored continuously. The Endoscope  was introduced through the mouth, and advanced to the                         third part of duodenum. The upper GI endoscopy was                         accomplished with ease. The patient tolerated the                         procedure  well. Findings:      The examined duodenum was normal.      The gastroesophageal flap valve was visualized endoscopically and       classified as Hill Grade III (minimal fold, loose to endoscope, hiatal       hernia likely).      The esophagus was normal. Impression:            - Normal examined duodenum.                        - Gastroesophageal flap valve classified as Hill Grade                         III (minimal fold, loose to endoscope, hiatal hernia                         likely).                        - Normal esophagus.                        - No specimens collected. Recommendation:        - Perform a colonoscopy today. Procedure Code(s):     --- Professional ---                        [phone], Esophagogastroduodenoscopy, flexible,                         transoral; diagnostic, including collection of                         specimen(s) by brushing or washing, when performed                         (separate procedure) Diagnosis Code(s):     --- Professional ---                        K21.9, Gastro-esophageal reflux disease without                         esophagitis CPT copyright 2022 American Medical Association. All rights reserved. The codes documented in this report are preliminary and upon coder review may  be revised to meet current compliance requirements. Ruel Kung, MD Ruel Kung MD, MD 03/01/2024 10:35:45 AM This report has been signed electronically. Number of Addenda: 0 Note Initiated On: 03/01/2024 10:17 AM Estimated Blood Loss:  Estimated blood loss: none.      [employer]

## 2024-03-01 NOTE — Anesthesia Preprocedure Evaluation (Signed)
"                                    Anesthesia Evaluation  Patient identified by MRN, date of birth, ID band Patient awake    Reviewed: Allergy & Precautions, NPO status , Patient's Chart, lab work & pertinent test results  Airway Mallampati: II  TM Distance: >3 FB Neck ROM: Full    Dental  (+) Teeth Intact   Pulmonary neg pulmonary ROS   Pulmonary exam normal        Cardiovascular Exercise Tolerance: Good negative cardio ROS Normal cardiovascular exam Rhythm:Regular Rate:Normal     Neuro/Psych negative neurological ROS  negative psych ROS   GI/Hepatic negative GI ROS, Neg liver ROS,,,  Endo/Other  negative endocrine ROS    Renal/GU negative Renal ROS  negative genitourinary   Musculoskeletal   Abdominal Normal abdominal exam  (+)   Peds negative pediatric ROS (+)  Hematology negative hematology ROS (+)   Anesthesia Other Findings Past Medical History: 1960: Allergy     Comment:  Penicillin No date: Basal cell carcinoma No date: Hypercholesterolemia  Past Surgical History: 11/10/2022: EYE SURGERY     Comment:  Torn retina-fully repaired 2000 and 2015?: HERNIA REPAIR     Comment:  No problems sionce  BMI    Body Mass Index: 20.05 kg/m      Reproductive/Obstetrics negative OB ROS                              Anesthesia Physical Anesthesia Plan  ASA: 1  Anesthesia Plan: General   Post-op Pain Management:    Induction: Intravenous  PONV Risk Score and Plan: Propofol  infusion and TIVA  Airway Management Planned: Natural Airway and Nasal Cannula  Additional Equipment:   Intra-op Plan:   Post-operative Plan:   Informed Consent: I have reviewed the patients History and Physical, chart, labs and discussed the procedure including the risks, benefits and alternatives for the proposed anesthesia with the patient or authorized representative who has indicated his/her understanding and acceptance.      Dental Advisory Given  Plan Discussed with: CRNA  Anesthesia Plan Comments:         Anesthesia Quick Evaluation  "

## 2024-03-02 ENCOUNTER — Telehealth: Payer: Self-pay

## 2024-03-02 NOTE — Telephone Encounter (Signed)
 Dupixent was prescribed by Dr Aleskerov. Has he notified him?  Please confirm doing ok.

## 2024-03-02 NOTE — Telephone Encounter (Signed)
 Please notify him that since he had his procedure yesterday, we are waiting on all of the results.  Please have him contact GI with questions about his procedure and we can f/u after. Keep us  posted.

## 2024-03-02 NOTE — Telephone Encounter (Signed)
 Copied from CRM #[id number]. Topic: Clinical - Prescription Issue >> Mar 01, 2024  5:05 PM Alfonso ORN wrote: Reason for CRM: pt wanted to update provider: stated will be a charge of $2100 for medication , unable to cover cost of medication and will need to discontinue - pt stated no evidence of medication being helpful (dupixant prescribed by other provider) . Pt completed colonoscopy and said was alarming results  Callback [phone]

## 2024-03-03 NOTE — Telephone Encounter (Signed)
 Pt notified. Pt stated he would reach out to GI for further recommendations

## 2024-03-09 NOTE — Telephone Encounter (Signed)
 FYI.. pt came in office and dropped off results from his procedure and has been placed in back mailbox.

## 2024-04-05 ENCOUNTER — Ambulatory Visit: Admitting: Internal Medicine
